# Patient Record
Sex: Female | Born: 1950 | Race: Black or African American | Hispanic: No | Marital: Married | Smoking: Former smoker
Health system: Southern US, Community
[De-identification: ages and names within clinical notes are randomized; demographics above are authoritative.]

## PROBLEM LIST (undated history)

## (undated) DIAGNOSIS — Z789 Other specified health status: Secondary | ICD-10-CM

## (undated) DIAGNOSIS — U071 COVID-19: Secondary | ICD-10-CM

## (undated) DIAGNOSIS — M069 Rheumatoid arthritis, unspecified: Secondary | ICD-10-CM

## (undated) DIAGNOSIS — K219 Gastro-esophageal reflux disease without esophagitis: Secondary | ICD-10-CM

## (undated) HISTORY — PX: APPENDECTOMY: SHX54

## (undated) HISTORY — PX: ABDOMINAL HYSTERECTOMY: SHX81

---

## 2005-06-04 ENCOUNTER — Emergency Department: Payer: Self-pay | Admitting: Emergency Medicine

## 2006-09-24 ENCOUNTER — Ambulatory Visit: Payer: Self-pay

## 2007-10-08 ENCOUNTER — Ambulatory Visit: Payer: Self-pay

## 2008-04-28 ENCOUNTER — Emergency Department: Payer: Self-pay | Admitting: Emergency Medicine

## 2008-11-27 ENCOUNTER — Emergency Department: Payer: Self-pay | Admitting: Emergency Medicine

## 2008-11-30 ENCOUNTER — Ambulatory Visit: Payer: Self-pay

## 2009-04-27 ENCOUNTER — Emergency Department: Payer: Self-pay | Admitting: Emergency Medicine

## 2010-05-26 ENCOUNTER — Emergency Department: Payer: Self-pay | Admitting: Emergency Medicine

## 2010-05-28 ENCOUNTER — Emergency Department: Payer: Self-pay | Admitting: Unknown Physician Specialty

## 2010-08-15 ENCOUNTER — Ambulatory Visit: Payer: Self-pay

## 2011-01-05 ENCOUNTER — Inpatient Hospital Stay: Payer: Self-pay | Admitting: *Deleted

## 2011-08-14 ENCOUNTER — Ambulatory Visit: Payer: Self-pay

## 2012-10-07 ENCOUNTER — Ambulatory Visit: Payer: Self-pay

## 2012-12-04 ENCOUNTER — Ambulatory Visit: Payer: Self-pay | Admitting: Physician Assistant

## 2013-08-31 ENCOUNTER — Ambulatory Visit: Payer: Self-pay | Admitting: Unknown Physician Specialty

## 2013-11-09 ENCOUNTER — Ambulatory Visit: Payer: Self-pay | Admitting: Family Medicine

## 2014-01-15 ENCOUNTER — Emergency Department: Payer: Self-pay | Admitting: Emergency Medicine

## 2014-01-15 LAB — URINALYSIS, COMPLETE
BILIRUBIN, UR: NEGATIVE
Glucose,UR: NEGATIVE mg/dL (ref 0–75)
NITRITE: NEGATIVE
PH: 5 (ref 4.5–8.0)
PROTEIN: NEGATIVE
SPECIFIC GRAVITY: 1.021 (ref 1.003–1.030)
WBC UR: 10 /HPF (ref 0–5)

## 2014-01-17 LAB — URINE CULTURE

## 2014-01-19 ENCOUNTER — Emergency Department: Payer: Self-pay | Admitting: Emergency Medicine

## 2014-01-19 LAB — CBC WITH DIFFERENTIAL/PLATELET
Basophil #: 0 10*3/uL (ref 0.0–0.1)
Basophil %: 0.4 %
Eosinophil #: 0 10*3/uL (ref 0.0–0.7)
Eosinophil %: 0.5 %
HCT: 39.5 % (ref 35.0–47.0)
HGB: 13 g/dL (ref 12.0–16.0)
Lymphocyte #: 1.6 10*3/uL (ref 1.0–3.6)
Lymphocyte %: 50.3 %
MCH: 28.5 pg (ref 26.0–34.0)
MCHC: 33 g/dL (ref 32.0–36.0)
MCV: 86 fL (ref 80–100)
Monocyte #: 0.4 x10 3/mm (ref 0.2–0.9)
Monocyte %: 12.7 %
Neutrophil #: 1.1 10*3/uL — ABNORMAL LOW (ref 1.4–6.5)
Neutrophil %: 36.1 %
Platelet: 169 10*3/uL (ref 150–440)
RBC: 4.57 10*6/uL (ref 3.80–5.20)
RDW: 14 % (ref 11.5–14.5)
WBC: 3.2 10*3/uL — ABNORMAL LOW (ref 3.6–11.0)

## 2014-01-19 LAB — URINALYSIS, COMPLETE
Bacteria: NONE SEEN
Bilirubin,UR: NEGATIVE
Glucose,UR: NEGATIVE mg/dL (ref 0–75)
Ketone: NEGATIVE
Nitrite: NEGATIVE
Ph: 6 (ref 4.5–8.0)
Protein: NEGATIVE
RBC,UR: 4 /HPF (ref 0–5)
Specific Gravity: 1.01 (ref 1.003–1.030)
Squamous Epithelial: 2
Transitional Epi: <1
WBC UR: 6 /HPF (ref 0–5)

## 2014-01-19 LAB — COMPREHENSIVE METABOLIC PANEL
ALBUMIN: 3.1 g/dL — AB (ref 3.4–5.0)
ALK PHOS: 58 U/L
ALT: 33 U/L (ref 12–78)
Anion Gap: 2 — ABNORMAL LOW (ref 7–16)
BUN: 7 mg/dL (ref 7–18)
Bilirubin,Total: 0.2 mg/dL (ref 0.2–1.0)
CO2: 32 mmol/L (ref 21–32)
CREATININE: 0.87 mg/dL (ref 0.60–1.30)
Calcium, Total: 8.1 mg/dL — ABNORMAL LOW (ref 8.5–10.1)
Chloride: 104 mmol/L (ref 98–107)
EGFR (Non-African Amer.): >60
GLUCOSE: 82 mg/dL (ref 65–99)
OSMOLALITY: 273 (ref 275–301)
POTASSIUM: 3.7 mmol/L (ref 3.5–5.1)
SGOT(AST): 44 U/L — ABNORMAL HIGH (ref 15–37)
Sodium: 138 mmol/L (ref 136–145)
TOTAL PROTEIN: 7.8 g/dL (ref 6.4–8.2)

## 2014-07-28 ENCOUNTER — Ambulatory Visit: Payer: Self-pay | Admitting: Physician Assistant

## 2014-11-24 ENCOUNTER — Ambulatory Visit: Payer: Self-pay | Admitting: Family Medicine

## 2015-08-04 ENCOUNTER — Other Ambulatory Visit: Payer: Self-pay | Admitting: Surgery

## 2015-08-04 DIAGNOSIS — M65811 Other synovitis and tenosynovitis, right shoulder: Secondary | ICD-10-CM

## 2015-08-04 DIAGNOSIS — M65812 Other synovitis and tenosynovitis, left shoulder: Secondary | ICD-10-CM

## 2015-08-04 DIAGNOSIS — M7541 Impingement syndrome of right shoulder: Secondary | ICD-10-CM

## 2015-08-04 DIAGNOSIS — M7542 Impingement syndrome of left shoulder: Secondary | ICD-10-CM

## 2015-08-12 ENCOUNTER — Ambulatory Visit
Admission: RE | Admit: 2015-08-12 | Discharge: 2015-08-12 | Disposition: A | Discharge status: Home / Self Care | Payer: Commercial Managed Care - HMO | Source: Ambulatory Visit | Attending: Surgery | Admitting: Surgery

## 2015-08-12 DIAGNOSIS — M65811 Other synovitis and tenosynovitis, right shoulder: Secondary | ICD-10-CM

## 2015-08-12 DIAGNOSIS — M7582 Other shoulder lesions, left shoulder: Secondary | ICD-10-CM | POA: Diagnosis not present

## 2015-08-12 DIAGNOSIS — M7542 Impingement syndrome of left shoulder: Secondary | ICD-10-CM

## 2015-08-12 DIAGNOSIS — M7552 Bursitis of left shoulder: Secondary | ICD-10-CM | POA: Insufficient documentation

## 2015-08-12 DIAGNOSIS — M19011 Primary osteoarthritis, right shoulder: Secondary | ICD-10-CM | POA: Diagnosis not present

## 2015-08-12 DIAGNOSIS — M7541 Impingement syndrome of right shoulder: Secondary | ICD-10-CM

## 2015-08-12 DIAGNOSIS — M19012 Primary osteoarthritis, left shoulder: Secondary | ICD-10-CM | POA: Insufficient documentation

## 2015-08-12 DIAGNOSIS — M65812 Other synovitis and tenosynovitis, left shoulder: Secondary | ICD-10-CM

## 2015-11-22 ENCOUNTER — Encounter: Payer: Self-pay | Admitting: *Deleted

## 2015-11-23 DIAGNOSIS — M65811 Other synovitis and tenosynovitis, right shoulder: Secondary | ICD-10-CM | POA: Diagnosis not present

## 2015-11-23 DIAGNOSIS — M75101 Unspecified rotator cuff tear or rupture of right shoulder, not specified as traumatic: Secondary | ICD-10-CM | POA: Diagnosis not present

## 2015-11-30 ENCOUNTER — Encounter
Admission: RE | Disposition: A | Discharge status: Home / Self Care | Payer: Self-pay | Source: Ambulatory Visit | Attending: Surgery

## 2015-11-30 ENCOUNTER — Ambulatory Visit
Admission: RE | Admit: 2015-11-30 | Discharge: 2015-11-30 | Disposition: A | Discharge status: Home / Self Care | Payer: PPO | Source: Ambulatory Visit | Attending: Surgery | Admitting: Surgery

## 2015-11-30 ENCOUNTER — Ambulatory Visit: Payer: PPO | Admitting: Anesthesiology

## 2015-11-30 DIAGNOSIS — M75121 Complete rotator cuff tear or rupture of right shoulder, not specified as traumatic: Secondary | ICD-10-CM | POA: Insufficient documentation

## 2015-11-30 DIAGNOSIS — M7541 Impingement syndrome of right shoulder: Secondary | ICD-10-CM | POA: Diagnosis not present

## 2015-11-30 DIAGNOSIS — J3089 Other allergic rhinitis: Secondary | ICD-10-CM | POA: Diagnosis not present

## 2015-11-30 DIAGNOSIS — Z79899 Other long term (current) drug therapy: Secondary | ICD-10-CM | POA: Diagnosis not present

## 2015-11-30 DIAGNOSIS — K219 Gastro-esophageal reflux disease without esophagitis: Secondary | ICD-10-CM | POA: Insufficient documentation

## 2015-11-30 DIAGNOSIS — M25811 Other specified joint disorders, right shoulder: Secondary | ICD-10-CM | POA: Diagnosis not present

## 2015-11-30 DIAGNOSIS — Z886 Allergy status to analgesic agent status: Secondary | ICD-10-CM | POA: Insufficient documentation

## 2015-11-30 DIAGNOSIS — Z7951 Long term (current) use of inhaled steroids: Secondary | ICD-10-CM | POA: Insufficient documentation

## 2015-11-30 DIAGNOSIS — M65811 Other synovitis and tenosynovitis, right shoulder: Secondary | ICD-10-CM | POA: Diagnosis not present

## 2015-11-30 HISTORY — PX: SHOULDER ARTHROSCOPY: SHX128

## 2015-11-30 HISTORY — DX: Other specified health status: Z78.9

## 2015-11-30 SURGERY — ARTHROSCOPY, SHOULDER
Anesthesia: Monitor Anesthesia Care | Laterality: Right

## 2015-11-30 MED ORDER — METOCLOPRAMIDE HCL 5 MG/ML IJ SOLN: 5.0000 mg | Freq: Three times a day (TID) | INTRAMUSCULAR | Status: DC | PRN

## 2015-11-30 MED ORDER — LACTATED RINGERS IV SOLN
INTRAVENOUS | Status: DC | PRN
  Administered 2015-11-30: 1500 mL

## 2015-11-30 MED ORDER — ONDANSETRON HCL 4 MG/2ML IJ SOLN: 4.0000 mg | Freq: Four times a day (QID) | INTRAMUSCULAR | Status: DC | PRN

## 2015-11-30 MED ORDER — LACTATED RINGERS IV SOLN
INTRAVENOUS | Status: DC | PRN
  Administered 2015-11-30: 14:00:00 via INTRAVENOUS

## 2015-11-30 MED ORDER — PROPOFOL 10 MG/ML IV BOLUS
INTRAVENOUS | Status: DC | PRN
  Administered 2015-11-30: 50 mg via INTRAVENOUS

## 2015-11-30 MED ORDER — ROPIVACAINE HCL 5 MG/ML IJ SOLN
INTRAMUSCULAR | Status: DC | PRN
  Administered 2015-11-30: 200 mg via EPIDURAL

## 2015-11-30 MED ORDER — DEXAMETHASONE SODIUM PHOSPHATE 4 MG/ML IJ SOLN
INTRAMUSCULAR | Status: DC | PRN
  Administered 2015-11-30: 4 mg via INTRAVENOUS

## 2015-11-30 MED ORDER — MIDAZOLAM HCL 2 MG/2ML IJ SOLN
INTRAMUSCULAR | Status: DC | PRN
  Administered 2015-11-30: 2 mg via INTRAVENOUS

## 2015-11-30 MED ORDER — CEFAZOLIN SODIUM-DEXTROSE 2-3 GM-% IV SOLR: 2.0000 g | Freq: Once | INTRAVENOUS | Status: DC

## 2015-11-30 MED ORDER — OXYCODONE HCL 5 MG PO TABS: 5.0000 mg | ORAL_TABLET | ORAL | Status: DC | PRN

## 2015-11-30 MED ORDER — ACETAMINOPHEN 160 MG/5ML PO SOLN
325.0000 mg | ORAL | Status: DC | PRN
Start: 2015-11-30 — End: 2015-11-30

## 2015-11-30 MED ORDER — METOCLOPRAMIDE HCL 5 MG PO TABS: 5.0000 mg | ORAL_TABLET | Freq: Three times a day (TID) | ORAL | Status: DC | PRN

## 2015-11-30 MED ORDER — OXYCODONE HCL 5 MG/5ML PO SOLN: 5.0000 mg | Freq: Once | ORAL | Status: DC | PRN

## 2015-11-30 MED ORDER — FENTANYL CITRATE (PF) 100 MCG/2ML IJ SOLN
INTRAMUSCULAR | Status: DC | PRN
  Administered 2015-11-30: 100 ug via INTRAVENOUS

## 2015-11-30 MED ORDER — OXYCODONE HCL 5 MG PO TABS: 5.0000 mg | ORAL_TABLET | Freq: Once | ORAL | Status: DC | PRN

## 2015-11-30 MED ORDER — BUPIVACAINE-EPINEPHRINE (PF) 0.5% -1:200000 IJ SOLN
INTRAMUSCULAR | Status: DC | PRN
  Administered 2015-11-30: 20 mL

## 2015-11-30 MED ORDER — POTASSIUM CHLORIDE IN NACL 20-0.9 MEQ/L-% IV SOLN: INTRAVENOUS | Status: DC

## 2015-11-30 MED ORDER — LACTATED RINGERS IV SOLN: INTRAVENOUS | Status: DC

## 2015-11-30 MED ORDER — ACETAMINOPHEN 325 MG PO TABS: 325.0000 mg | ORAL_TABLET | ORAL | Status: DC | PRN

## 2015-11-30 MED ORDER — ONDANSETRON HCL 4 MG/2ML IJ SOLN
INTRAMUSCULAR | Status: DC | PRN
  Administered 2015-11-30: 4 mg via INTRAVENOUS

## 2015-11-30 MED ORDER — ONDANSETRON HCL 4 MG PO TABS
4.0000 mg | ORAL_TABLET | Freq: Four times a day (QID) | ORAL | Status: DC | PRN
Start: 2015-11-30 — End: 2015-11-30

## 2015-11-30 MED ORDER — CEFAZOLIN SODIUM-DEXTROSE 2-3 GM-% IV SOLR
INTRAVENOUS | Status: DC | PRN
  Administered 2015-11-30: 2 g via INTRAVENOUS

## 2015-11-30 MED ORDER — ONDANSETRON HCL 4 MG/2ML IJ SOLN: 4.0000 mg | Freq: Once | INTRAMUSCULAR | Status: DC | PRN

## 2015-11-30 SURGICAL SUPPLY — 39 items
ANCHOR JUGGERKNOT WTAP NDL 2.9 (Anchor) ×3 IMPLANT
BIT DRILL JUGRKNT W/NDL BIT2.9 (DRILL) ×1 IMPLANT
BLADE FULL RADIUS 3.5 (BLADE) ×3 IMPLANT
BLADE SHAVER 4.5X7 STR FR (MISCELLANEOUS) IMPLANT
BUR ACROMIONIZER 4.0 (BURR) ×3 IMPLANT
BUR BR 5.5 WIDE MOUTH (BURR) IMPLANT
CHLORAPREP W/TINT 26ML (MISCELLANEOUS) ×6 IMPLANT
COVER LIGHT HANDLE UNIVERSAL (MISCELLANEOUS) ×6 IMPLANT
COVER MAYO STAND STRL (DRAPES) ×3 IMPLANT
DRAPE IMP U-DRAPE 54X76 (DRAPES) ×6 IMPLANT
DRILL JUGGERKNOT W/NDL BIT 2.9 (DRILL) ×3
GAUZE PETRO XEROFOAM 1X8 (MISCELLANEOUS) ×3 IMPLANT
GAUZE SPONGE 4X4 12PLY STRL (GAUZE/BANDAGES/DRESSINGS) ×3 IMPLANT
GLOVE BIO SURGEON STRL SZ8 (GLOVE) ×6 IMPLANT
GLOVE INDICATOR 8.0 STRL GRN (GLOVE) ×3 IMPLANT
GOWN STRL REUS W/ TWL LRG LVL3 (GOWN DISPOSABLE) ×2 IMPLANT
GOWN STRL REUS W/ TWL XL LVL3 (GOWN DISPOSABLE) ×1 IMPLANT
GOWN STRL REUS W/TWL LRG LVL3 (GOWN DISPOSABLE) ×4
GOWN STRL REUS W/TWL XL LVL3 (GOWN DISPOSABLE) ×2
IV LACTATED RINGER IRRG 3000ML (IV SOLUTION) ×2
IV LR IRRIG 3000ML ARTHROMATIC (IV SOLUTION) ×1 IMPLANT
KIT CANNULA 8X76-LX IN CANNULA (CANNULA) ×3 IMPLANT
KIT ROOM TURNOVER OR (KITS) ×3 IMPLANT
MANIFOLD 4PT FOR NEPTUNE1 (MISCELLANEOUS) ×3 IMPLANT
MAT BLUE FLOOR 46X72 FLO (MISCELLANEOUS) ×3 IMPLANT
NDL MAYO CATGUT SZ5 (NEEDLE)
NDL SUT 5 .5 CRC TPR PNT MAYO (NEEDLE) IMPLANT
NEEDLE HYPO 21X1.5 SAFETY (NEEDLE) ×3 IMPLANT
PACK ARTHROSCOPY SHOULDER (MISCELLANEOUS) ×3 IMPLANT
PAD GROUND ADULT SPLIT (MISCELLANEOUS) ×3 IMPLANT
STAPLER SKIN PROX 35W (STAPLE) ×3 IMPLANT
STRAP BODY AND KNEE 60X3 (MISCELLANEOUS) ×6 IMPLANT
SUT ETHIBOND 0 MO6 C/R (SUTURE) ×3 IMPLANT
SUT VIC AB 2-0 CT1 27 (SUTURE) ×2
SUT VIC AB 2-0 CT1 TAPERPNT 27 (SUTURE) ×1 IMPLANT
SYR 30ML LL (SYRINGE) IMPLANT
TAPE MICROFOAM 4IN (TAPE) ×3 IMPLANT
TUBING ARTHRO INFLOW-ONLY STRL (TUBING) ×3 IMPLANT
WAND HAND CNTRL MULTIVAC 90 (MISCELLANEOUS) ×3 IMPLANT

## 2015-11-30 NOTE — Anesthesia Postprocedure Evaluation (Signed)
Anesthesia Post Note  Patient: Donna Powell  Procedure(s) Performed: Procedure(s) (LRB): ARTHROSCOPY SHOULDER DEBRIDEMENT DECOMPRESSION ROTATOR CUFF REPAIR (Right)  Patient location during evaluation: PACU Anesthesia Type: General and Regional Level of consciousness: awake and alert Pain management: pain level controlled Vital Signs Assessment: post-procedure vital signs reviewed and stable Respiratory status: spontaneous breathing, nonlabored ventilation, respiratory function stable and patient connected to nasal cannula oxygen Cardiovascular status: blood pressure returned to baseline and stable Postop Assessment: no signs of nausea or vomiting Anesthetic complications: no    Amaryllis Dyke

## 2015-11-30 NOTE — Anesthesia Procedure Notes (Addendum)
Anesthesia Regional Block:  Interscalene brachial plexus block  Pre-Anesthetic Checklist: ,, timeout performed, Correct Patient, Correct Site, Correct Laterality, Correct Procedure, Correct Position, site marked, Risks and benefits discussed,  Surgical consent,  Pre-op evaluation,  At surgeon's request and post-op pain management  Laterality: Right  Prep: chloraprep       Needles:  Injection technique: Single-shot  Needle Type: Stimiplex     Needle Length: 10cm 10 cm Needle Gauge: 21 and 21 G    Additional Needles:  Procedures: ultrasound guided (picture in chart) Interscalene brachial plexus block Narrative:  Start time: 11/30/2015 1:54 PM End time: 11/30/2015 2:00 PM Injection made incrementally with aspirations every 40 mL.  Performed by: Personally  Anesthesiologist: Amaryllis Dyke  Additional Notes: Patient tolerated procedure very well. Prior to block pain was 10/10. Pain was 0/10 before going to the OR.    Procedure Name: LMA Insertion Date/Time: 11/30/2015 2:30 PM Performed by: Londell Moh Pre-anesthesia Checklist: Patient identified, Emergency Drugs available, Suction available, Timeout performed and Patient being monitored Patient Re-evaluated:Patient Re-evaluated prior to inductionOxygen Delivery Method: Circle system utilized Preoxygenation: Pre-oxygenation with 100% oxygen Intubation Type: Combination inhalational/ intravenous induction LMA: LMA inserted LMA Size: 4.0 Number of attempts: 1 Placement Confirmation: positive ETCO2 and breath sounds checked- equal and bilateral Tube secured with: Tape

## 2015-11-30 NOTE — H&P (Signed)
Paper H&P to be scanned into permanent record. H&P reviewed. No changes. 

## 2015-11-30 NOTE — Op Note (Signed)
11/30/2015  3:52 PM  Patient:   Donna Powell  Pre-Op Diagnosis:   Impingement/tendinopathy with probable rotator cuff tear, right shoulder.  Postoperative diagnosis: Impingement/tendinopathy with full thickness rotator cuff tear, right shoulder.  Procedure: Limited arthroscopic debridement, arthroscopic subacromial decompression, and mini-open rotator cuff repair, right shoulder.  Anesthesia: General laryngeal mask with interscalene block placed preoperatively by the anesthesiologist.  Surgeon:   Pascal Lux, MD  Assistant:   None  Findings: As above. The labrum demonstrated some mild fraying anteriorly and superiorly but had no glenoid detachment. There was a small full-thickness tear involving the anterior insertional fibers of the supraspinatus tendon. The biceps tendon was in excellent condition as was the rest of the rotator cuff. There were grade 1 chondromalacial changes of the glenoid centrally, but otherwise the articular surfaces of both the glenoid and humerus were in excellent condition.  Complications: None  Fluids:   400 cc  Estimated blood loss: <5 cc  Tourniquet time: None  Drains: None  Closure: Staples   Brief clinical note: The patient is a 65 year old female with a 1+ year history of right shoulder pain. The patient's symptoms have progressed despite medications, activity modification, etc. The patient's history and examination are consistent with impingement/tendinopathy with a probable rotator cuff tear as suggested by MRI scan. The patient presents at this time for definitive management of these shoulder symptoms.  Procedure: The patient underwent placement of an interscalene block by the anesthesiologist in the preoperative holding area before being brought into the operating room and lain in the supine position. The patient then underwent general laryngeal mask anesthesia before being repositioned in the beach chair position  using the beach chair positioner. The right shoulder and upper extremity were prepped with ChloraPrep solution before being draped sterilely. Preoperative antibiotics were administered. A timeout was performed to confirm the proper surgical site before the expected portal sites and incision site were injected with 0.5% Sensorcaine with epinephrine. A posterior portal was created and the glenohumeral joint thoroughly inspected with the findings as described above. An anterior portal was created using an outside-in technique. The labrum and rotator cuff were further probed, again confirming the above-noted findings. The areas of labral fraying anteriorly and superiorly were debrided using the full-radius resector as were some areas of synovitis superiorly. The ArthroCare wand was inserted and used to obtain hemostasis as well as to "anneal" the labrum superiorly and anteriorly. The instruments were removed from the joint after suctioning the excess fluid.  The camera was repositioned through the posterior portal into the subacromial space. A separate lateral portal was created using an outside-in technique. The 3.5 mm full-radius resector was introduced and used to perform a subtotal bursectomy. The ArthroCare wand was then inserted and used to remove the periosteal tissue off the undersurface of the anterior third of the acromion as well as to recess the coracoacromial ligament from its attachment along the anterior and lateral margins of the acromion. The 4.0 mm acromionizing bur was introduced and used to complete the decompression by removing the undersurface of the anterior third of the acromion. The full radius resector was reintroduced to remove any residual bony debris before the ArthroCare wand was reintroduced to obtain hemostasis. The instruments were then removed from the subacromial space after suctioning the excess fluid.  An approximately 3.5-4 cm incision was made over the anterolateral aspect of  the shoulder beginning at the anterolateral corner of the acromion and extending distally in line with the bicipital groove. This  incision was carried down through the subcutaneous tissues to expose the deltoid fascia. The raphae between the anterior and middle thirds was identified and this plane developed to provide access into the subacromial space. Additional bursal tissues were debrided sharply using Metzenbaum scissors. The rotator cuff tear was readily identified. The margins were debrided sharply with a #15 blade and the exposed greater tuberosity roughened with a rongeur. The tear was repaired using a single Biomet 2.9 mm JuggerKnot anchors. Several of these sutures were then brought back laterally through the more lateral rotator cuff tissues and retied to create a two-layer closure. An additional #0 Ethibond suture was used to reapproximate the small longitudinal component of the tear. An apparent watertight closure was obtained.  The wound was copiously irrigated with sterile saline solution before the deltoid raphae was reapproximated using 2-0 Vicryl interrupted sutures. The subcutaneous tissues were closed in two layers using 2-0 Vicryl interrupted sutures before the skin was closed using staples. The portal sites also were closed using staples. A sterile bulky dressing was applied to the shoulder before the arm was placed into a shoulder immobilizer. The patient was then awakened, extubated, and returned to the recovery room in satisfactory condition after tolerating the procedure well.

## 2015-11-30 NOTE — Anesthesia Preprocedure Evaluation (Signed)
Anesthesia Evaluation  Patient identified by MRN, date of birth, ID band Patient awake    Reviewed: Allergy & Precautions, NPO status , Patient's Chart, lab work & pertinent test results  Airway Mallampati: II  TM Distance: >3 FB Neck ROM: full    Dental   Pulmonary former smoker,    Pulmonary exam normal        Cardiovascular Normal cardiovascular exam     Neuro/Psych    GI/Hepatic   Endo/Other    Renal/GU      Musculoskeletal   Abdominal   Peds  Hematology   Anesthesia Other Findings   Reproductive/Obstetrics                             Anesthesia Physical Anesthesia Plan  ASA: I  Anesthesia Plan: Regional and MAC   Post-op Pain Management:    Induction:   Airway Management Planned:   Additional Equipment:   Intra-op Plan:   Post-operative Plan:   Informed Consent: I have reviewed the patients History and Physical, chart, labs and discussed the procedure including the risks, benefits and alternatives for the proposed anesthesia with the patient or authorized representative who has indicated his/her understanding and acceptance.     Plan Discussed with: CRNA  Anesthesia Plan Comments:         Anesthesia Quick Evaluation

## 2015-11-30 NOTE — Discharge Instructions (Signed)
General Anesthesia, Adult, Care After Refer to this sheet in the next few weeks. These instructions provide you with information on caring for yourself after your procedure. Your health care provider may also give you more specific instructions. Your treatment has been planned according to current medical practices, but problems sometimes occur. Call your health care provider if you have any problems or questions after your procedure. WHAT TO EXPECT AFTER THE PROCEDURE After the procedure, it is typical to experience:  Sleepiness.  Nausea and vomiting. HOME CARE INSTRUCTIONS  For the first 24 hours after general anesthesia:  Have a responsible person with you.  Do not drive a car. If you are alone, do not take public transportation.  Do not drink alcohol.  Do not take medicine that has not been prescribed by your health care provider.  Do not sign important papers or make important decisions.  You may resume a normal diet and activities as directed by your health care provider.  Change bandages (dressings) as directed.  If you have questions or problems that seem related to general anesthesia, call the hospital and ask for the anesthetist or anesthesiologist on call. SEEK MEDICAL CARE IF:  You have nausea and vomiting that continue the day after anesthesia.  You develop a rash. SEEK IMMEDIATE MEDICAL CARE IF:   You have difficulty breathing.  You have chest pain.  You have any allergic problems.   This information is not intended to replace advice given to you by your health care provider. Make sure you discuss any questions you have with your health care provider.   Document Released: 02/11/2001 Document Revised: 11/26/2014 Document Reviewed: 03/05/2012 Elsevier Interactive Patient Education 2016 [employer].  Keep dressing dry and intact.  May shower after dressing changed on post-op day #4 (Sunday).  Cover staples with Band-Aids after drying off. Apply ice  frequently to shoulder. Keep shoulder immobilizer on at all times except may remove for bathing purposes. Follow-up in 10-14 days or as scheduled.

## 2015-11-30 NOTE — Transfer of Care (Signed)
Immediate Anesthesia Transfer of Care Note  Patient: Donna Powell  Procedure(s) Performed: Procedure(s): ARTHROSCOPY SHOULDER DEBRIDEMENT DECOMPRESSION ROTATOR CUFF REPAIR (Right)  Patient Location: PACU  Anesthesia Type: Regional, MAC  Level of Consciousness: awake, alert  and patient cooperative  Airway and Oxygen Therapy: Patient Spontanous Breathing and Patient connected to supplemental oxygen  Post-op Assessment: Post-op Vital signs reviewed, Patient's Cardiovascular Status Stable, Respiratory Function Stable, Patent Airway and No signs of Nausea or vomiting  Post-op Vital Signs: Reviewed and stable  Complications: No apparent anesthesia complications

## 2015-12-01 ENCOUNTER — Encounter: Payer: Self-pay | Admitting: Surgery

## 2015-12-07 DIAGNOSIS — M25511 Pain in right shoulder: Secondary | ICD-10-CM | POA: Diagnosis not present

## 2015-12-12 DIAGNOSIS — M25511 Pain in right shoulder: Secondary | ICD-10-CM | POA: Diagnosis not present

## 2016-03-05 DIAGNOSIS — M21611 Bunion of right foot: Secondary | ICD-10-CM | POA: Diagnosis not present

## 2016-03-05 DIAGNOSIS — M722 Plantar fascial fibromatosis: Secondary | ICD-10-CM | POA: Diagnosis not present

## 2016-03-30 DIAGNOSIS — M75121 Complete rotator cuff tear or rupture of right shoulder, not specified as traumatic: Secondary | ICD-10-CM | POA: Diagnosis not present

## 2016-03-30 DIAGNOSIS — M7542 Impingement syndrome of left shoulder: Secondary | ICD-10-CM | POA: Diagnosis not present

## 2016-03-30 DIAGNOSIS — M7541 Impingement syndrome of right shoulder: Secondary | ICD-10-CM | POA: Diagnosis not present

## 2016-03-30 DIAGNOSIS — M65811 Other synovitis and tenosynovitis, right shoulder: Secondary | ICD-10-CM | POA: Diagnosis not present

## 2016-05-08 DIAGNOSIS — D485 Neoplasm of uncertain behavior of skin: Secondary | ICD-10-CM | POA: Diagnosis not present

## 2016-05-09 DIAGNOSIS — D2339 Other benign neoplasm of skin of other parts of face: Secondary | ICD-10-CM | POA: Diagnosis not present

## 2016-05-28 DIAGNOSIS — M545 Low back pain: Secondary | ICD-10-CM | POA: Diagnosis not present

## 2016-05-28 DIAGNOSIS — M9903 Segmental and somatic dysfunction of lumbar region: Secondary | ICD-10-CM | POA: Diagnosis not present

## 2016-05-28 DIAGNOSIS — M791 Myalgia: Secondary | ICD-10-CM | POA: Diagnosis not present

## 2016-07-13 DIAGNOSIS — M62838 Other muscle spasm: Secondary | ICD-10-CM | POA: Diagnosis not present

## 2016-07-13 DIAGNOSIS — M722 Plantar fascial fibromatosis: Secondary | ICD-10-CM | POA: Diagnosis not present

## 2016-07-19 DIAGNOSIS — M722 Plantar fascial fibromatosis: Secondary | ICD-10-CM | POA: Diagnosis not present

## 2016-07-19 DIAGNOSIS — M2011 Hallux valgus (acquired), right foot: Secondary | ICD-10-CM | POA: Diagnosis not present

## 2016-08-09 DIAGNOSIS — M2011 Hallux valgus (acquired), right foot: Secondary | ICD-10-CM | POA: Diagnosis not present

## 2016-08-09 DIAGNOSIS — M722 Plantar fascial fibromatosis: Secondary | ICD-10-CM | POA: Diagnosis not present

## 2016-09-10 DIAGNOSIS — M9901 Segmental and somatic dysfunction of cervical region: Secondary | ICD-10-CM | POA: Diagnosis not present

## 2016-09-10 DIAGNOSIS — M4602 Spinal enthesopathy, cervical region: Secondary | ICD-10-CM | POA: Diagnosis not present

## 2016-10-24 DIAGNOSIS — Z0000 Encounter for general adult medical examination without abnormal findings: Secondary | ICD-10-CM | POA: Diagnosis not present

## 2016-10-24 DIAGNOSIS — M8580 Other specified disorders of bone density and structure, unspecified site: Secondary | ICD-10-CM | POA: Diagnosis not present

## 2016-10-24 DIAGNOSIS — Z23 Encounter for immunization: Secondary | ICD-10-CM | POA: Diagnosis not present

## 2016-10-24 DIAGNOSIS — Z1231 Encounter for screening mammogram for malignant neoplasm of breast: Secondary | ICD-10-CM | POA: Diagnosis not present

## 2016-10-24 DIAGNOSIS — K219 Gastro-esophageal reflux disease without esophagitis: Secondary | ICD-10-CM | POA: Diagnosis not present

## 2017-01-03 DIAGNOSIS — M9901 Segmental and somatic dysfunction of cervical region: Secondary | ICD-10-CM | POA: Diagnosis not present

## 2017-01-03 DIAGNOSIS — M542 Cervicalgia: Secondary | ICD-10-CM | POA: Diagnosis not present

## 2017-01-08 DIAGNOSIS — M722 Plantar fascial fibromatosis: Secondary | ICD-10-CM | POA: Diagnosis not present

## 2017-01-08 DIAGNOSIS — M2011 Hallux valgus (acquired), right foot: Secondary | ICD-10-CM | POA: Diagnosis not present

## 2017-03-22 DIAGNOSIS — M9901 Segmental and somatic dysfunction of cervical region: Secondary | ICD-10-CM | POA: Diagnosis not present

## 2017-03-22 DIAGNOSIS — M542 Cervicalgia: Secondary | ICD-10-CM | POA: Diagnosis not present

## 2017-05-15 DIAGNOSIS — J029 Acute pharyngitis, unspecified: Secondary | ICD-10-CM | POA: Diagnosis not present

## 2017-06-19 DIAGNOSIS — M545 Low back pain: Secondary | ICD-10-CM | POA: Diagnosis not present

## 2017-06-19 DIAGNOSIS — M9903 Segmental and somatic dysfunction of lumbar region: Secondary | ICD-10-CM | POA: Diagnosis not present

## 2017-06-19 DIAGNOSIS — M791 Myalgia: Secondary | ICD-10-CM | POA: Diagnosis not present

## 2017-07-18 DIAGNOSIS — M2011 Hallux valgus (acquired), right foot: Secondary | ICD-10-CM | POA: Diagnosis not present

## 2017-07-18 DIAGNOSIS — M722 Plantar fascial fibromatosis: Secondary | ICD-10-CM | POA: Diagnosis not present

## 2017-10-31 DIAGNOSIS — M2011 Hallux valgus (acquired), right foot: Secondary | ICD-10-CM | POA: Diagnosis not present

## 2017-10-31 DIAGNOSIS — M722 Plantar fascial fibromatosis: Secondary | ICD-10-CM | POA: Diagnosis not present

## 2017-11-14 DIAGNOSIS — M9901 Segmental and somatic dysfunction of cervical region: Secondary | ICD-10-CM | POA: Diagnosis not present

## 2017-11-14 DIAGNOSIS — M4602 Spinal enthesopathy, cervical region: Secondary | ICD-10-CM | POA: Diagnosis not present

## 2017-12-16 DIAGNOSIS — M9901 Segmental and somatic dysfunction of cervical region: Secondary | ICD-10-CM | POA: Diagnosis not present

## 2017-12-16 DIAGNOSIS — G44219 Episodic tension-type headache, not intractable: Secondary | ICD-10-CM | POA: Diagnosis not present

## 2017-12-16 DIAGNOSIS — M4602 Spinal enthesopathy, cervical region: Secondary | ICD-10-CM | POA: Diagnosis not present

## 2018-02-17 DIAGNOSIS — Z0000 Encounter for general adult medical examination without abnormal findings: Secondary | ICD-10-CM | POA: Diagnosis not present

## 2018-02-17 DIAGNOSIS — K219 Gastro-esophageal reflux disease without esophagitis: Secondary | ICD-10-CM | POA: Diagnosis not present

## 2018-02-17 DIAGNOSIS — Z1231 Encounter for screening mammogram for malignant neoplasm of breast: Secondary | ICD-10-CM | POA: Diagnosis not present

## 2018-02-17 DIAGNOSIS — M8580 Other specified disorders of bone density and structure, unspecified site: Secondary | ICD-10-CM | POA: Diagnosis not present

## 2018-02-17 DIAGNOSIS — M21611 Bunion of right foot: Secondary | ICD-10-CM | POA: Diagnosis not present

## 2018-02-18 ENCOUNTER — Other Ambulatory Visit: Payer: Self-pay | Admitting: Podiatry

## 2018-02-18 DIAGNOSIS — M2011 Hallux valgus (acquired), right foot: Secondary | ICD-10-CM | POA: Diagnosis not present

## 2018-02-19 NOTE — Discharge Instructions (Signed)
[facility] °[facility] ° °POST OPERATIVE INSTRUCTIONS FOR DR. TROXLER AND DR. FOWLER °[facility] ° ° °1. Take your medication as prescribed.  Pain medication should be taken only as needed. ° °2. Keep the dressing clean, dry and intact. ° °3. Keep your foot elevated above the heart level for the first 48 hours. ° °4. Walking to the bathroom and brief periods of walking are acceptable, unless we have instructed you to be non-weight bearing. ° °5. Always wear your post-op shoe when walking.  Always use your crutches if you are to be non-weight bearing. ° °6. Do not take a shower. Baths are permissible as long as the foot is kept out of the water.  ° °7. Every hour you are awake:  °- Bend your knee 15 times. °- Flex foot 15 times °- Massage calf 15 times ° °8. Call [facility] ([phone]) if any of the following problems occur: °- You develop a temperature or fever. °- The bandage becomes saturated with blood. °- Medication does not stop your pain. °- Injury of the foot occurs. °- Any symptoms of infection including redness, odor, or red streaks running from wound. ° ° °General Anesthesia, Adult, Care After °These instructions provide you with information about caring for yourself after your procedure. Your health care provider may also give you more specific instructions. Your treatment has been planned according to current medical practices, but problems sometimes occur. Call your health care provider if you have any problems or questions after your procedure. °What can I expect after the procedure? °After the procedure, it is common to have: °· Vomiting. °· A sore throat. °· Mental slowness. ° °It is common to feel: °· Nauseous. °· Cold or shivery. °· Sleepy. °· Tired. °· Sore or achy, even in parts of your body where you did not have surgery. ° °Follow these instructions at home: °For at least 24 hours after the procedure: °· Do not: °? Participate in  activities where you could fall or become injured. °? Drive. °? Use heavy machinery. °? Drink alcohol. °? Take sleeping pills or medicines that cause drowsiness. °? Make important decisions or sign legal documents. °? Take care of children on your own. °· Rest. °Eating and drinking °· If you vomit, drink water, juice, or soup when you can drink without vomiting. °· Drink enough fluid to keep your urine clear or pale yellow. °· Make sure you have little or no nausea before eating solid foods. °· Follow the diet recommended by your health care provider. °General instructions °· Have a responsible adult stay with you until you are awake and alert. °· Return to your normal activities as told by your health care provider. Ask your health care provider what activities are safe for you. °· Take over-the-counter and prescription medicines only as told by your health care provider. °· If you smoke, do not smoke without supervision. °· Keep all follow-up visits as told by your health care provider. This is important. °Contact a health care provider if: °· You continue to have nausea or vomiting at home, and medicines are not helpful. °· You cannot drink fluids or start eating again. °· You cannot urinate after 8-12 hours. °· You develop a skin rash. °· You have fever. °· You have increasing redness at the site of your procedure. °Get help right away if: °· You have difficulty breathing. °· You have chest pain. °· You have unexpected bleeding. °· You feel that you   are having a life-threatening or urgent problem. °This information is not intended to replace advice given to you by your health care provider. Make sure you discuss any questions you have with your health care provider. °Document Released: 02/11/2001 Document Revised: 04/09/2016 Document Reviewed: 10/20/2015 °Elsevier Interactive Patient Education © 2018 Elsevier Inc. ° °

## 2018-02-21 DIAGNOSIS — K219 Gastro-esophageal reflux disease without esophagitis: Secondary | ICD-10-CM | POA: Diagnosis not present

## 2018-02-21 DIAGNOSIS — E7800 Pure hypercholesterolemia, unspecified: Secondary | ICD-10-CM | POA: Diagnosis not present

## 2018-02-21 DIAGNOSIS — Z01818 Encounter for other preprocedural examination: Secondary | ICD-10-CM | POA: Diagnosis not present

## 2018-02-26 ENCOUNTER — Ambulatory Visit
Admission: RE | Admit: 2018-02-26 | Discharge: 2018-02-26 | Disposition: A | Discharge status: Home / Self Care | Payer: PPO | Source: Ambulatory Visit | Attending: Podiatry | Admitting: Podiatry

## 2018-02-26 ENCOUNTER — Encounter
Admission: RE | Disposition: A | Discharge status: Home / Self Care | Payer: Self-pay | Source: Ambulatory Visit | Attending: Podiatry

## 2018-02-26 ENCOUNTER — Ambulatory Visit: Payer: PPO | Admitting: Anesthesiology

## 2018-02-26 DIAGNOSIS — Z886 Allergy status to analgesic agent status: Secondary | ICD-10-CM | POA: Diagnosis not present

## 2018-02-26 DIAGNOSIS — Z79899 Other long term (current) drug therapy: Secondary | ICD-10-CM | POA: Insufficient documentation

## 2018-02-26 DIAGNOSIS — M2011 Hallux valgus (acquired), right foot: Secondary | ICD-10-CM | POA: Diagnosis not present

## 2018-02-26 DIAGNOSIS — M1990 Unspecified osteoarthritis, unspecified site: Secondary | ICD-10-CM | POA: Insufficient documentation

## 2018-02-26 DIAGNOSIS — Z87891 Personal history of nicotine dependence: Secondary | ICD-10-CM | POA: Diagnosis not present

## 2018-02-26 DIAGNOSIS — K219 Gastro-esophageal reflux disease without esophagitis: Secondary | ICD-10-CM | POA: Diagnosis not present

## 2018-02-26 DIAGNOSIS — M8580 Other specified disorders of bone density and structure, unspecified site: Secondary | ICD-10-CM | POA: Diagnosis not present

## 2018-02-26 HISTORY — PX: HALLUX VALGUS AUSTIN: SHX6623

## 2018-02-26 HISTORY — DX: Rheumatoid arthritis, unspecified: M06.9

## 2018-02-26 SURGERY — CORRECTION, HALLUX VALGUS
Anesthesia: General | Site: Foot | Laterality: Right | Wound class: "Clean "

## 2018-02-26 MED ORDER — LACTATED RINGERS IV SOLN
10.0000 mL/h | INTRAVENOUS | Status: DC
  Administered 2018-02-26: 10 mL/h via INTRAVENOUS

## 2018-02-26 MED ORDER — DEXAMETHASONE SODIUM PHOSPHATE 4 MG/ML IJ SOLN
INTRAMUSCULAR | Status: DC | PRN
  Administered 2018-02-26: 8 mg via INTRAVENOUS

## 2018-02-26 MED ORDER — PROPOFOL 10 MG/ML IV BOLUS
INTRAVENOUS | Status: DC | PRN
  Administered 2018-02-26: 200 mg via INTRAVENOUS

## 2018-02-26 MED ORDER — POVIDONE-IODINE 7.5 % EX SOLN
Freq: Once | CUTANEOUS | Status: AC
  Administered 2018-02-26: 12:00:00 via TOPICAL

## 2018-02-26 MED ORDER — ONDANSETRON HCL 4 MG/2ML IJ SOLN
INTRAMUSCULAR | Status: DC | PRN
  Administered 2018-02-26: 4 mg via INTRAVENOUS

## 2018-02-26 MED ORDER — MIDAZOLAM HCL 5 MG/5ML IJ SOLN
INTRAMUSCULAR | Status: DC | PRN
  Administered 2018-02-26: 2 mg via INTRAVENOUS

## 2018-02-26 MED ORDER — OXYCODONE-ACETAMINOPHEN 5-325 MG PO TABS: 1.0000 | ORAL_TABLET | ORAL | 0 refills | Status: AC | PRN

## 2018-02-26 MED ORDER — BUPIVACAINE HCL (PF) 0.25 % IJ SOLN
INTRAMUSCULAR | Status: DC | PRN
  Administered 2018-02-26: 10 mL

## 2018-02-26 MED ORDER — BUPIVACAINE LIPOSOME 1.3 % IJ SUSP
INTRAMUSCULAR | Status: DC | PRN
  Administered 2018-02-26: 10 mL

## 2018-02-26 MED ORDER — CEFAZOLIN SODIUM-DEXTROSE 2-4 GM/100ML-% IV SOLN
2.0000 g | INTRAVENOUS | Status: AC
  Administered 2018-02-26: 2 g via INTRAVENOUS

## 2018-02-26 MED ORDER — LIDOCAINE HCL (CARDIAC) 20 MG/ML IV SOLN
INTRAVENOUS | Status: DC | PRN
  Administered 2018-02-26: 40 mg via INTRATRACHEAL

## 2018-02-26 SURGICAL SUPPLY — 37 items
BANDAGE ELASTIC 4 VELCRO NS (GAUZE/BANDAGES/DRESSINGS) ×2 IMPLANT
BENZOIN TINCTURE PRP APPL 2/3 (GAUZE/BANDAGES/DRESSINGS) ×2 IMPLANT
BLADE MED AGGRESSIVE (BLADE) ×1 IMPLANT
BNDG COHESIVE 4X5 TAN STRL (GAUZE/BANDAGES/DRESSINGS) ×2 IMPLANT
BNDG ESMARK 4X12 TAN STRL LF (GAUZE/BANDAGES/DRESSINGS) ×2 IMPLANT
BNDG GAUZE 4.5X4.1 6PLY STRL (MISCELLANEOUS) ×2 IMPLANT
BNDG STRETCH 4X75 STRL LF (GAUZE/BANDAGES/DRESSINGS) ×2 IMPLANT
CANISTER SUCT 1200ML W/VALVE (MISCELLANEOUS) ×2 IMPLANT
COVER LIGHT HANDLE UNIVERSAL (MISCELLANEOUS) ×4 IMPLANT
CUFF TOURN SGL QUICK 18 (TOURNIQUET CUFF) ×1 IMPLANT
DRAPE FLUOR MINI C-ARM 54X84 (DRAPES) ×2 IMPLANT
DURAPREP 26ML APPLICATOR (WOUND CARE) ×2 IMPLANT
ELECT REM PT RETURN 9FT ADLT (ELECTROSURGICAL) ×2
ELECTRODE REM PT RTRN 9FT ADLT (ELECTROSURGICAL) ×1 IMPLANT
GAUZE PETRO XEROFOAM 1X8 (MISCELLANEOUS) ×2 IMPLANT
GAUZE SPONGE 4X4 12PLY STRL (GAUZE/BANDAGES/DRESSINGS) ×2 IMPLANT
GLOVE BIO SURGEON STRL SZ7.5 (GLOVE) ×2 IMPLANT
GLOVE INDICATOR 8.0 STRL GRN (GLOVE) ×2 IMPLANT
GOWN STRL REUS W/ TWL LRG LVL3 (GOWN DISPOSABLE) ×2 IMPLANT
GOWN STRL REUS W/TWL LRG LVL3 (GOWN DISPOSABLE) ×2
K-WIRE DBL END TROCAR 6X.045 (WIRE) ×2
K-WIRE DBL END TROCAR 6X.062 (WIRE) ×2
KIT TURNOVER KIT A (KITS) ×2 IMPLANT
KWIRE DBL END TROCAR 6X.045 (WIRE) IMPLANT
KWIRE DBL END TROCAR 6X.062 (WIRE) IMPLANT
NS IRRIG 500ML POUR BTL (IV SOLUTION) ×2 IMPLANT
PACK EXTREMITY ARMC (MISCELLANEOUS) ×2 IMPLANT
PIN BALLS 3/8 F/.045 WIRE (MISCELLANEOUS) ×2 IMPLANT
RASP SM TEAR CROSS CUT (RASP) ×1 IMPLANT
STOCKINETTE IMPERVIOUS LG (DRAPES) ×2 IMPLANT
STRAP BODY AND KNEE 60X3 (MISCELLANEOUS) ×2 IMPLANT
STRIP CLOSURE SKIN 1/4X4 (GAUZE/BANDAGES/DRESSINGS) ×2 IMPLANT
SUT MNCRL+ 5-0 UNDYED PC-3 (SUTURE) IMPLANT
SUT MONOCRYL 5-0 (SUTURE) ×1
SUT VIC AB 3-0 SH 27 (SUTURE) ×1
SUT VIC AB 3-0 SH 27X BRD (SUTURE) IMPLANT
SUT VIC AB 4-0 FS2 27 (SUTURE) ×1 IMPLANT

## 2018-02-26 NOTE — Transfer of Care (Signed)
Immediate Anesthesia Transfer of Care Note  Patient: Donna Powell  Procedure(s) Performed: HALLUX VALGUS [place]IN (Right Foot)  Patient Location: PACU  Anesthesia Type: General  Level of Consciousness: awake, alert  and patient cooperative  Airway and Oxygen Therapy: Patient Spontanous Breathing and Patient connected to supplemental oxygen  Post-op Assessment: Post-op Vital signs reviewed, Patient's Cardiovascular Status Stable, Respiratory Function Stable, Patent Airway and No signs of Nausea or vomiting  Post-op Vital Signs: Reviewed and stable  Complications: No apparent anesthesia complications

## 2018-02-26 NOTE — Op Note (Signed)
Operative note   Surgeon:Dhanvi Boesen    Assistant:none    Preop diagnosis: Right foot hallux valgus deformity    Postop diagnosis: Same    Procedure: [place] hallux valgus correction right foot    EBL: Minimal    Anesthesia:local and general.  Local consisted of a one-to-one mixture of 0.25% bupivacaine and Exparel long-acting anesthetic.  A total of 20 cc was used in a Mayo block fashion.    Hemostasis: Ankle tourniquet inflated to 200 mmHg for 33 minutes    Specimen: None    Complications: None    Operative indications:Donna Powell is an 67 y.o. that presents today for surgical intervention.  The risks/benefits/alternatives/complications have been discussed and consent has been given.    Procedure:  Patient was brought into the OR and placed on the operating table in thesupine position. After anesthesia was obtained theright lower extremity was prepped and draped in usual sterile fashion.  Attention was directed to the dorsomedial right first MTPJ where a dorsal medial incision was performed.  Sharp and blunt dissection carried down to the capsule.  A T capsulotomy was performed.  The dorsomedial eminence was noted and transected with a power saw.  A V osteotomy was created and the capital fragment was translocated laterally.  This was then stabilized with a 0.062 K wire.  Good realignment was noted of the first MTPJ.  The ensuing overhanging ledge was transected with a power saw.  At this point good realignment of the first metatarsal phalangeal joint was noted without compression between the great toe and second toe.  I decided not to perform the Akin osteotomy at this time to avoid overcorrection.  A small capsulorrhaphy was performed medially.  Closure was performed after irrigation of the wound with copious irrigation.  3-0 Vicryl for the capsular layer, 4-0 Vicryl for the subtenons tissue and 5-0 Monocryl undyed for skin.    Patient tolerated the procedure and anesthesia  well.  Was transported from the OR to the PACU with all vital signs stable and vascular status intact. To be discharged per routine protocol.  Will follow up in approximately 1 week in the outpatient clinic.

## 2018-02-26 NOTE — Anesthesia Postprocedure Evaluation (Signed)
Anesthesia Post Note  Patient: Donna Powell  Procedure(s) Performed: [place] (Right Foot)  Patient location during evaluation: PACU Anesthesia Type: General Level of consciousness: awake and alert Pain management: pain level controlled Vital Signs Assessment: post-procedure vital signs reviewed and stable Respiratory status: spontaneous breathing, nonlabored ventilation, respiratory function stable and patient connected to nasal cannula oxygen Cardiovascular status: blood pressure returned to baseline and stable Postop Assessment: no apparent nausea or vomiting Anesthetic complications: no    Marceline Napierala

## 2018-02-26 NOTE — Anesthesia Preprocedure Evaluation (Signed)
Anesthesia Evaluation  Patient identified by MRN, date of birth, ID band  Reviewed: NPO status   History of Anesthesia Complications Negative for: history of anesthetic complications  Airway Mallampati: II  TM Distance: >3 FB Neck ROM: full    Dental no notable dental hx.    Pulmonary neg pulmonary ROS, former smoker,    Pulmonary exam normal        Cardiovascular Exercise Tolerance: Good negative cardio ROS Normal cardiovascular exam     Neuro/Psych negative neurological ROS  negative psych ROS   GI/Hepatic Neg liver ROS, GERD  Controlled,  Endo/Other  negative endocrine ROS  Renal/GU negative Renal ROS  negative genitourinary   Musculoskeletal  (+) Arthritis ,   Abdominal   Peds  Hematology negative hematology ROS (+)   Anesthesia Other Findings pcp stable: 02/2018: dr. Lovie Macadamia;    Reproductive/Obstetrics                             Anesthesia Physical Anesthesia Plan  ASA: II  Anesthesia Plan: General   Post-op Pain Management:    Induction:   PONV Risk Score and Plan:   Airway Management Planned:   Additional Equipment:   Intra-op Plan:   Post-operative Plan:   Informed Consent: I have reviewed the patients History and Physical, chart, labs and discussed the procedure including the risks, benefits and alternatives for the proposed anesthesia with the patient or authorized representative who has indicated his/her understanding and acceptance.     Plan Discussed with: CRNA  Anesthesia Plan Comments:         Anesthesia Quick Evaluation

## 2018-02-26 NOTE — Anesthesia Procedure Notes (Signed)
Procedure Name: LMA Insertion Date/Time: 02/26/2018 12:12 PM Performed by: Janna Arch, CRNA Pre-anesthesia Checklist: Patient identified, Emergency Drugs available, Suction available and Patient being monitored Patient Re-evaluated:Patient Re-evaluated prior to induction Oxygen Delivery Method: Circle system utilized Preoxygenation: Pre-oxygenation with 100% oxygen Induction Type: IV induction Ventilation: Mask ventilation without difficulty LMA: LMA inserted LMA Size: 4.0 Number of attempts: 1 Tube secured with: Tape Dental Injury: Teeth and Oropharynx as per pre-operative assessment

## 2018-02-26 NOTE — H&P (Signed)
HISTORY AND PHYSICAL INTERVAL NOTE:  02/26/2018  11:57 AM  Donna Powell  has presented today for surgery, with the diagnosis of Hallux Valgus.  The various methods of treatment have been discussed with the patient.  No guarantees were given.  After consideration of risks, benefits and other options for treatment, the patient has consented to surgery.  I have reviewed the patients' chart and labs.    Patient Vitals for the past 24 hrs:  BP Temp Temp src Pulse Resp SpO2 Height Weight  02/26/18 1050 133/77 (!) 97.3 F (36.3 C) Temporal 66 16 100 % 5\' 4"  (1.626 m) 64.4 kg (142 lb)    A history and physical examination was performed in my office.  The patient was reexamined.  There have been no changes to this history and physical examination.  Samara Deist A

## 2018-02-27 ENCOUNTER — Encounter: Payer: Self-pay | Admitting: Podiatry

## 2018-03-04 DIAGNOSIS — M2011 Hallux valgus (acquired), right foot: Secondary | ICD-10-CM | POA: Diagnosis not present

## 2018-03-20 DIAGNOSIS — M2011 Hallux valgus (acquired), right foot: Secondary | ICD-10-CM | POA: Diagnosis not present

## 2018-04-09 DIAGNOSIS — M2011 Hallux valgus (acquired), right foot: Secondary | ICD-10-CM | POA: Diagnosis not present

## 2018-05-14 DIAGNOSIS — J019 Acute sinusitis, unspecified: Secondary | ICD-10-CM | POA: Diagnosis not present

## 2018-05-27 DIAGNOSIS — M79671 Pain in right foot: Secondary | ICD-10-CM | POA: Diagnosis not present

## 2018-05-27 DIAGNOSIS — T85848S Pain due to other internal prosthetic devices, implants and grafts, sequela: Secondary | ICD-10-CM | POA: Diagnosis not present

## 2018-05-28 DIAGNOSIS — M79605 Pain in left leg: Secondary | ICD-10-CM | POA: Diagnosis not present

## 2018-05-28 DIAGNOSIS — M79604 Pain in right leg: Secondary | ICD-10-CM | POA: Diagnosis not present

## 2018-06-05 DIAGNOSIS — T63441A Toxic effect of venom of bees, accidental (unintentional), initial encounter: Secondary | ICD-10-CM | POA: Diagnosis not present

## 2018-06-06 DIAGNOSIS — T63441A Toxic effect of venom of bees, accidental (unintentional), initial encounter: Secondary | ICD-10-CM | POA: Diagnosis not present

## 2018-06-23 DIAGNOSIS — M4602 Spinal enthesopathy, cervical region: Secondary | ICD-10-CM | POA: Diagnosis not present

## 2018-06-23 DIAGNOSIS — M9901 Segmental and somatic dysfunction of cervical region: Secondary | ICD-10-CM | POA: Diagnosis not present

## 2018-07-27 DIAGNOSIS — J019 Acute sinusitis, unspecified: Secondary | ICD-10-CM | POA: Diagnosis not present

## 2018-08-11 DIAGNOSIS — M9901 Segmental and somatic dysfunction of cervical region: Secondary | ICD-10-CM | POA: Diagnosis not present

## 2018-08-11 DIAGNOSIS — M4602 Spinal enthesopathy, cervical region: Secondary | ICD-10-CM | POA: Diagnosis not present

## 2018-08-27 DIAGNOSIS — M79605 Pain in left leg: Secondary | ICD-10-CM | POA: Diagnosis not present

## 2018-08-27 DIAGNOSIS — I8393 Asymptomatic varicose veins of bilateral lower extremities: Secondary | ICD-10-CM | POA: Diagnosis not present

## 2018-08-27 DIAGNOSIS — Z23 Encounter for immunization: Secondary | ICD-10-CM | POA: Diagnosis not present

## 2018-08-27 DIAGNOSIS — M79604 Pain in right leg: Secondary | ICD-10-CM | POA: Diagnosis not present

## 2018-09-22 ENCOUNTER — Encounter (INDEPENDENT_AMBULATORY_CARE_PROVIDER_SITE_OTHER): Payer: Self-pay | Admitting: Vascular Surgery

## 2018-09-22 ENCOUNTER — Ambulatory Visit (INDEPENDENT_AMBULATORY_CARE_PROVIDER_SITE_OTHER): Payer: PPO | Admitting: Vascular Surgery

## 2018-09-22 VITALS — BP 138/84 | HR 71 | Resp 16 | Ht 65.0 in | Wt 141.2 lb

## 2018-09-22 DIAGNOSIS — Z87891 Personal history of nicotine dependence: Secondary | ICD-10-CM | POA: Diagnosis not present

## 2018-09-22 DIAGNOSIS — I83813 Varicose veins of bilateral lower extremities with pain: Secondary | ICD-10-CM

## 2018-09-22 DIAGNOSIS — R600 Localized edema: Secondary | ICD-10-CM

## 2018-09-23 ENCOUNTER — Encounter (INDEPENDENT_AMBULATORY_CARE_PROVIDER_SITE_OTHER): Payer: Self-pay | Admitting: Vascular Surgery

## 2018-09-23 DIAGNOSIS — R600 Localized edema: Secondary | ICD-10-CM | POA: Insufficient documentation

## 2018-09-23 DIAGNOSIS — I83813 Varicose veins of bilateral lower extremities with pain: Secondary | ICD-10-CM | POA: Insufficient documentation

## 2018-09-23 NOTE — Progress Notes (Signed)
Subjective:    Patient ID: Donna Powell, female    DOB: 1951/10/08, 67 y.o.   MRN: [id number] Chief Complaint  Patient presents with  . New Patient (Initial Visit)    ref Eulas Post efor spider veins   Presents as a new patient referred by Physician Assistant Eulas Post for evaluation of "venous disease".  The patient endorses a long-standing history of varicosities located to the bilateral legs however has noticed an increase in size, number and discomfort over the last "few years".  The patient also experiences bilateral lower extremity swelling which is also associated with discomfort.  The patient notes that her discomfort worsens with sitting and standing for long periods of time.  The patient notes that her symptoms have progressed to the point that she is unable to function on a daily basis and they have become lifestyle limiting.  At this time the patient does engage in conservative therapy including wearing medical grade 1 compression socks, elevating her legs and remaining active however this has provided minimal improvement to her symptoms.  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  Patient denies any recent surgery or trauma, DVT or recurrent bouts of cellulitis to the bilateral legs.  Patient denies any claudication-like symptoms, rest pain or ulcer formation to the bilateral lower extremity.  The patient denies any fever, nausea vomiting.  Review of Systems  Constitutional: Negative.   HENT: Negative.   Eyes: Negative.   Respiratory: Negative.   Cardiovascular: Positive for leg swelling.       Painful varicose veins  Gastrointestinal: Negative.   Endocrine: Negative.   Genitourinary: Negative.   Musculoskeletal: Negative.   Skin: Negative.   Allergic/Immunologic: Negative.   Neurological: Negative.   Hematological: Negative.   Psychiatric/Behavioral: Negative.       Objective:   Physical Exam  Constitutional: She is  oriented to person, place, and time. She appears well-developed and well-nourished. No distress.  HENT:  Head: Normocephalic and atraumatic.  Right Ear: External ear normal.  Left Ear: External ear normal.  Eyes: Pupils are equal, round, and reactive to light. Conjunctivae and EOM are normal.  Neck: Normal range of motion.  Cardiovascular: Normal rate, regular rhythm, normal heart sounds and intact distal pulses.  Pulses:      Radial pulses are 2+ on the right side, and 2+ on the left side.       Dorsalis pedis pulses are 2+ on the right side, and 2+ on the left side.       Posterior tibial pulses are 2+ on the right side, and 2+ on the left side.  Pulmonary/Chest: Effort normal and breath sounds normal.  Musculoskeletal: Normal range of motion. She exhibits edema (Mild bilateral lower extremity nonpitting edema noted).  Neurological: She is alert and oriented to person, place, and time.  Skin: Skin is warm and dry. She is not diaphoretic.  Diffuse less than 1 cm varicosities noted to the bilateral legs.  Is no stasis dermatitis, fibrosis, cellulitis or active ulcerations noted at this time.  Psychiatric: She has a normal mood and affect. Her behavior is normal. Judgment and thought content normal.  Vitals reviewed.  BP 138/84 (BP Location: Right Arm)   Pulse 71   Resp 16   Ht 5\' 5"  (1.651 m)   Wt 141 lb 3.2 oz (64 kg)   BMI 23.50 kg/m   Past Medical History:  Diagnosis Date  . Medical history non-contributory   .  Rheumatoid arthritis ([place])    Social History   Socioeconomic History  . Marital status: Married    Spouse name: Not on file  . Number of children: Not on file  . Years of education: Not on file  . Highest education level: Not on file  Occupational History  . Not on file  Social Needs  . Financial resource strain: Not on file  . Food insecurity:    Worry: Not on file    Inability: Not on file  . Transportation needs:    Medical: Not on file    Non-medical:  Not on file  Tobacco Use  . Smoking status: Former Research scientist (life sciences)  . Smokeless tobacco: Never Used  . Tobacco comment: quit about 10 yrs ago  Substance and Sexual Activity  . Alcohol use: No  . Drug use: Never  . Sexual activity: Not on file  Lifestyle  . Physical activity:    Days per week: Not on file    Minutes per session: Not on file  . Stress: Not on file  Relationships  . Social connections:    Talks on phone: Not on file    Gets together: Not on file    Attends religious service: Not on file    Active member of club or organization: Not on file    Attends meetings of clubs or organizations: Not on file    Relationship status: Not on file  . Intimate partner violence:    Fear of current or ex partner: Not on file    Emotionally abused: Not on file    Physically abused: Not on file    Forced sexual activity: Not on file  Other Topics Concern  . Not on file  Social History Narrative  . Not on file   Past Surgical History:  Procedure Laterality Date  . ABDOMINAL HYSTERECTOMY    . APPENDECTOMY    . HALLUX VALGUS AUSTIN Right 02/26/2018   Procedure: HALLUX VALGUS AUSTIN;  Surgeon: Samara Deist, DPM;  Location: [facility];  Service: Podiatry;  Laterality: Right;  GENERAL WITH LOCAL  . SHOULDER ARTHROSCOPY Right 11/30/2015   Procedure: Limited arthroscopic debridement, arthroscopic subacromial decompression, and mini-open rotator cuff repair, right shoulder. ;  Surgeon: Corky Mull, MD;  Location: [facility];  Service: Orthopedics;  Laterality: Right;   Family History  Problem Relation Age of Onset  . Hypertension Mother   . Varicose Veins Paternal Aunt   . Asthma Maternal Grandmother    Allergies  Allergen Reactions  . Aspirin Nausea And Vomiting      Assessment & Plan:  Presents as a new patient referred by Physician Assistant Eulas Post for evaluation of "venous disease".  The patient endorses a long-standing history of varicosities located to the  bilateral legs however has noticed an increase in size, number and discomfort over the last "few years".  The patient also experiences bilateral lower extremity swelling which is also associated with discomfort.  The patient notes that her discomfort worsens with sitting and standing for long periods of time.  The patient notes that her symptoms have progressed to the point that she is unable to function on a daily basis and they have become lifestyle limiting.  At this time the patient does engage in conservative therapy including wearing medical grade 1 compression socks, elevating her legs and remaining active however this has provided minimal improvement to her symptoms.  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  Patient denies any recent surgery or trauma, DVT or recurrent bouts of cellulitis to the bilateral legs.  Patient denies any claudication-like symptoms, rest pain or ulcer formation to the bilateral lower extremity.  The patient denies any fever, nausea vomiting.  1. Bilateral lower extremity edema - New The patient was encouraged to wear graduated compression stockings (20-30 mmHg) on a daily basis. The patient was instructed to begin wearing the stockings first thing in the morning and removing them in the evening. The patient was instructed specifically not to sleep in the stockings. Prescription given.  In addition, behavioral modification including elevation during the day will be initiated. Anti-inflammatories for pain. I will bring the patient back and have her undergo bilateral lower extremity venous duplex to rule out any contributing venous versus lymphatic disease The patient will follow up in one months to asses conservative management.  Information on compression stockings was given to the patient. The patient was instructed to call the office in the interim if any worsening edema or ulcerations to the legs, feet or toes  occurs. The patient expresses their understanding.  - VAS [place] LOWER EXTREMITY VENOUS REFLUX; Future  2. Varicose veins of both lower extremities with pain - New As above  - VAS [place] LOWER EXTREMITY VENOUS REFLUX; Future  Current Outpatient Medications on File Prior to Visit  Medication Sig Dispense Refill  . CALCIUM PO Take by mouth.    . celecoxib (CELEBREX) 100 MG capsule Take 100 mg by mouth 2 (two) times daily.    . Multiple Vitamin (MULTIVITAMIN) tablet Take 1 tablet by mouth daily.    Marland Kitchen omeprazole (PRILOSEC) 20 MG capsule Take 20 mg by mouth as needed.    Marland Kitchen oxyCODONE (ROXICODONE) 5 MG immediate release tablet Take 1-2 tablets (5-10 mg total) by mouth every 4 (four) hours as needed for severe pain. (Patient not taking: Reported on 02/18/2018) 60 tablet 0  . oxyCODONE-acetaminophen (PERCOCET) 5-325 MG tablet Take 1 tablet by mouth every 4 (four) hours as needed for severe pain. (Patient not taking: Reported on 09/22/2018) 20 tablet 0  . oxyCODONE-acetaminophen (PERCOCET) 5-325 MG tablet Take 1 tablet by mouth every 4 (four) hours as needed for severe pain. (Patient not taking: Reported on 09/22/2018) 20 tablet 0   No current facility-administered medications on file prior to visit.    There are no Patient Instructions on file for this visit. Return in about 1 month (around 10/22/2018), or if symptoms worsen or fail to improve.  Esteen Delpriore A Allyn Bartelson, PA-C

## 2018-10-20 DIAGNOSIS — M9903 Segmental and somatic dysfunction of lumbar region: Secondary | ICD-10-CM | POA: Diagnosis not present

## 2018-10-20 DIAGNOSIS — M542 Cervicalgia: Secondary | ICD-10-CM | POA: Diagnosis not present

## 2018-10-20 DIAGNOSIS — M545 Low back pain: Secondary | ICD-10-CM | POA: Diagnosis not present

## 2018-10-20 DIAGNOSIS — M9901 Segmental and somatic dysfunction of cervical region: Secondary | ICD-10-CM | POA: Diagnosis not present

## 2018-10-21 ENCOUNTER — Other Ambulatory Visit (INDEPENDENT_AMBULATORY_CARE_PROVIDER_SITE_OTHER): Payer: Self-pay | Admitting: Vascular Surgery

## 2018-10-21 DIAGNOSIS — M7989 Other specified soft tissue disorders: Principal | ICD-10-CM

## 2018-10-21 DIAGNOSIS — M79669 Pain in unspecified lower leg: Secondary | ICD-10-CM

## 2018-10-23 ENCOUNTER — Encounter (INDEPENDENT_AMBULATORY_CARE_PROVIDER_SITE_OTHER): Payer: Self-pay | Admitting: Vascular Surgery

## 2018-10-23 ENCOUNTER — Ambulatory Visit (INDEPENDENT_AMBULATORY_CARE_PROVIDER_SITE_OTHER): Payer: PPO | Admitting: Vascular Surgery

## 2018-10-23 ENCOUNTER — Ambulatory Visit (INDEPENDENT_AMBULATORY_CARE_PROVIDER_SITE_OTHER): Payer: PPO

## 2018-10-23 VITALS — BP 129/82 | HR 68 | Resp 17 | Wt 141.0 lb

## 2018-10-23 DIAGNOSIS — R600 Localized edema: Secondary | ICD-10-CM

## 2018-10-23 DIAGNOSIS — I83813 Varicose veins of bilateral lower extremities with pain: Secondary | ICD-10-CM

## 2018-10-23 DIAGNOSIS — M79604 Pain in right leg: Secondary | ICD-10-CM | POA: Diagnosis not present

## 2018-10-23 DIAGNOSIS — M79669 Pain in unspecified lower leg: Secondary | ICD-10-CM | POA: Diagnosis not present

## 2018-10-23 DIAGNOSIS — M79605 Pain in left leg: Secondary | ICD-10-CM

## 2018-10-23 DIAGNOSIS — M7989 Other specified soft tissue disorders: Principal | ICD-10-CM

## 2018-10-26 ENCOUNTER — Encounter (INDEPENDENT_AMBULATORY_CARE_PROVIDER_SITE_OTHER): Payer: Self-pay | Admitting: Vascular Surgery

## 2018-10-26 DIAGNOSIS — M79606 Pain in leg, unspecified: Secondary | ICD-10-CM | POA: Insufficient documentation

## 2018-10-26 NOTE — Progress Notes (Signed)
MRN : [id number]  Donna Powell is a 67 y.o. (1951/09/04) female who presents with chief complaint of  Chief Complaint  Patient presents with  . Follow-up    19month bil venous reflux and abi  .  History of Present Illness:  The patient returns to the office for followup evaluation regarding leg pain and swelling.  The swelling has improved quite a bit with compression and elevation.  Also the pain associated with swelling has decreased substantially. There have not been any interval development of a ulcerations or wounds.  Since the previous visit the patient has been wearing graduated compression stockings and has noted little significant improvement in the lymphedema. The patient has been using compression routinely morning until night.  The patient also states elevation during the day and exercise is being done too.  Venous Duplex shows a normal deep system bilaterally no significant superficial reflux identified. ABI's are normal bilaterally   Current Meds  Medication Sig  . CALCIUM PO Take by mouth.  . celecoxib (CELEBREX) 100 MG capsule Take 100 mg by mouth 2 (two) times daily.  . Multiple Vitamin (MULTIVITAMIN) tablet Take 1 tablet by mouth daily.  Marland Kitchen omeprazole (PRILOSEC) 20 MG capsule Take 20 mg by mouth as needed.    Past Medical History:  Diagnosis Date  . Medical history non-contributory   . Rheumatoid arthritis [facility])     Past Surgical History:  Procedure Laterality Date  . ABDOMINAL HYSTERECTOMY    . APPENDECTOMY    . HALLUX VALGUS [place]IN Right 02/26/2018   Procedure: HALLUX VALGUS [place];  Surgeon: Samara Deist, DPM;  Location: [facility];  Service: Podiatry;  Laterality: Right;  GENERAL WITH LOCAL  . SHOULDER ARTHROSCOPY Right 11/30/2015   Procedure: Limited arthroscopic debridement, arthroscopic subacromial decompression, and mini-open rotator cuff repair, right shoulder. ;  Surgeon: Corky Mull, MD;  Location: [facility];  Service:  Orthopedics;  Laterality: Right;    Social History Social History   Tobacco Use  . Smoking status: Former Research scientist (life sciences)  . Smokeless tobacco: Never Used  . Tobacco comment: quit about 10 yrs ago  Substance Use Topics  . Alcohol use: No  . Drug use: Never    Family History Family History  Problem Relation Age of Onset  . Hypertension Mother   . Varicose Veins Paternal Aunt   . Asthma Maternal Grandmother     Allergies  Allergen Reactions  . Aspirin Nausea And Vomiting     REVIEW OF SYSTEMS (Negative unless checked)  Constitutional: [] Weight loss  [] Fever  [] Chills Cardiac: [] Chest pain   [] Chest pressure   [] Palpitations   [] Shortness of breath when laying flat   [] Shortness of breath with exertion. Vascular:  [] Pain in legs with walking   [x] Pain in legs with prolonged standing  [] History of DVT   [] Phlebitis   [x] Swelling in legs   [x] Varicose veins   [] Non-healing ulcers Pulmonary:   [] Uses home oxygen   [] Productive cough   [] Hemoptysis   [] Wheeze  [] COPD   [] Asthma Neurologic:  [] Dizziness   [] Seizures   [] History of stroke   [] History of TIA  [] Aphasia   [] Vissual changes   [] Weakness or numbness in arm   [] Weakness or numbness in leg Musculoskeletal:   [] Joint swelling   [] Joint pain   [] Low back pain Hematologic:  [] Easy bruising  [] Easy bleeding   [] Hypercoagulable state   [] Anemic Gastrointestinal:  [] Diarrhea   [] Vomiting  [] Gastroesophageal reflux/heartburn   [] Difficulty swallowing. Genitourinary:  []   Chronic kidney disease   [] Difficult urination  [] Frequent urination   [] Blood in urine Skin:  [] Rashes   [] Ulcers  Psychological:  [] History of anxiety   []  History of major depression.  Physical Examination  Vitals:   10/23/18 1539  BP: 129/82  Pulse: 68  Resp: 17  Weight: 141 lb (64 kg)   Body mass index is 23.46 kg/m. Gen: WD/WN, NAD Head: [place]/AT, No temporalis wasting.  Ear/Nose/Throat: Hearing grossly intact, nares w/o erythema or drainage Eyes: PER,  EOMI, sclera nonicteric.  Neck: Supple, no large masses.   Pulmonary:  Good air movement, no audible wheezing bilaterally, no use of accessory muscles.  Cardiac: RRR, no JVD Vascular: scattered varicosities present bilaterally.  Mild venous stasis changes to the legs bilaterally.  2+ soft pitting edema Vessel Right Left  Radial Palpable Palpable  PT Trace Palpable Trace Palpable  DP Trace Palpable Trace Palpable  Gastrointestinal: Non-distended. No guarding/no peritoneal signs.  Musculoskeletal: M/S 5/5 throughout.  No deformity or atrophy.  Neurologic: CN 2-12 intact. Symmetrical.  Speech is fluent. Motor exam as listed above. Psychiatric: Judgment intact, Mood & affect appropriate for pt's clinical situation. Dermatologic: Mild venous rashes no ulcers noted.  No changes consistent with cellulitis. Lymph : No lichenification or skin changes of chronic lymphedema.  CBC Lab Results  Component Value Date   WBC 3.2 (L) 01/19/2014   HGB 13.0 01/19/2014   HCT 39.5 01/19/2014   MCV 86 01/19/2014   PLT 169 01/19/2014    BMET    Component Value Date/Time   NA 138 01/19/2014 1036   K 3.7 01/19/2014 1036   CL 104 01/19/2014 1036   CO2 32 01/19/2014 1036   GLUCOSE 82 01/19/2014 1036   BUN 7 01/19/2014 1036   CREATININE 0.87 01/19/2014 1036   CALCIUM 8.1 (L) 01/19/2014 1036   GFRNONAA >60 01/19/2014 1036   GFRAA >60 01/19/2014 1036   CrCl cannot be calculated (Patient's most recent lab result is older than the maximum 21 days allowed.).  COAG No results found for: INR, PROTIME  Radiology No results found.  Assessment/Plan 1. Varicose veins of both lower extremities with pain Recommend:  The patient is complaining of varicose veins.    I have had a long discussion with the patient regarding  varicose veins and why they cause symptoms.  Patient will begin wearing graduated compression stockings on a daily basis, beginning first thing in the morning and removing them in the  evening. The patient is instructed specifically not to sleep in the stockings.    The patient  will also begin using over-the-counter analgesics such as Motrin 600 mg po TID to help control the symptoms as needed.    In addition, behavioral modification including elevation during the day will be initiated, utilizing a recliner was recommended.  The patient is also instructed to continue exercising such as walking 4-5 times per week.  At this time the patient wishes to continue conservative therapy and is not interested in more invasive treatments such as laser ablation and sclerotherapy.  The Patient will follow up PRN if the symptoms worsen.  2. Bilateral lower extremity edema Recommend:  The patient is complaining of varicose veins.    I have had a long discussion with the patient regarding  varicose veins and why they cause symptoms.  Patient will begin wearing graduated compression stockings on a daily basis, beginning first thing in the morning and removing them in the evening. The patient is instructed specifically not to  sleep in the stockings.    The patient  will also begin using over-the-counter analgesics such as Motrin 600 mg po TID to help control the symptoms as needed.    In addition, behavioral modification including elevation during the day will be initiated, utilizing a recliner was recommended.  The patient is also instructed to continue exercising such as walking 4-5 times per week.  At this time the patient wishes to continue conservative therapy and is not interested in more invasive treatments such as laser ablation and sclerotherapy.  The Patient will follow up PRN if the symptoms worsen.  3. Pain in both lower extremities Recommend:  The patient is complaining of varicose veins.    I have had a long discussion with the patient regarding  varicose veins and why they cause symptoms.  Patient will begin wearing graduated compression stockings on a daily basis,  beginning first thing in the morning and removing them in the evening. The patient is instructed specifically not to sleep in the stockings.    The patient  will also begin using over-the-counter analgesics such as Motrin 600 mg po TID to help control the symptoms as needed.    In addition, behavioral modification including elevation during the day will be initiated, utilizing a recliner was recommended.  The patient is also instructed to continue exercising such as walking 4-5 times per week.  At this time the patient wishes to continue conservative therapy and is not interested in more invasive treatments such as laser ablation and sclerotherapy.  The Patient will follow up PRN if the symptoms worsen.    Hortencia Pilar, MD  10/26/2018 3:46 PM

## 2019-03-16 DIAGNOSIS — K219 Gastro-esophageal reflux disease without esophagitis: Secondary | ICD-10-CM | POA: Diagnosis not present

## 2019-03-16 DIAGNOSIS — R413 Other amnesia: Secondary | ICD-10-CM | POA: Diagnosis not present

## 2019-03-16 DIAGNOSIS — Z0000 Encounter for general adult medical examination without abnormal findings: Secondary | ICD-10-CM | POA: Diagnosis not present

## 2019-03-16 DIAGNOSIS — E7800 Pure hypercholesterolemia, unspecified: Secondary | ICD-10-CM | POA: Diagnosis not present

## 2019-03-16 DIAGNOSIS — M8580 Other specified disorders of bone density and structure, unspecified site: Secondary | ICD-10-CM | POA: Diagnosis not present

## 2019-03-16 DIAGNOSIS — R252 Cramp and spasm: Secondary | ICD-10-CM | POA: Diagnosis not present

## 2019-03-18 ENCOUNTER — Other Ambulatory Visit: Payer: Self-pay | Admitting: Family Medicine

## 2019-03-18 DIAGNOSIS — Z1231 Encounter for screening mammogram for malignant neoplasm of breast: Secondary | ICD-10-CM

## 2019-05-25 ENCOUNTER — Other Ambulatory Visit: Payer: Self-pay

## 2019-05-25 ENCOUNTER — Ambulatory Visit
Admission: RE | Admit: 2019-05-25 | Discharge: 2019-05-25 | Disposition: A | Discharge status: Home / Self Care | Payer: PPO | Source: Ambulatory Visit | Attending: Family Medicine | Admitting: Family Medicine

## 2019-05-25 DIAGNOSIS — Z1231 Encounter for screening mammogram for malignant neoplasm of breast: Secondary | ICD-10-CM | POA: Insufficient documentation

## 2019-09-16 DIAGNOSIS — Z23 Encounter for immunization: Secondary | ICD-10-CM | POA: Diagnosis not present

## 2019-10-17 DIAGNOSIS — S2341XA Sprain of ribs, initial encounter: Secondary | ICD-10-CM | POA: Diagnosis not present

## 2019-10-23 DIAGNOSIS — R109 Unspecified abdominal pain: Secondary | ICD-10-CM | POA: Diagnosis not present

## 2019-11-06 ENCOUNTER — Emergency Department
Admission: EM | Admit: 2019-11-06 | Discharge: 2019-11-06 | Disposition: A | Discharge status: Home / Self Care | Payer: PPO | Attending: Emergency Medicine | Admitting: Emergency Medicine

## 2019-11-06 ENCOUNTER — Encounter: Payer: Self-pay | Admitting: Emergency Medicine

## 2019-11-06 ENCOUNTER — Other Ambulatory Visit: Payer: Self-pay

## 2019-11-06 DIAGNOSIS — Z79899 Other long term (current) drug therapy: Secondary | ICD-10-CM | POA: Insufficient documentation

## 2019-11-06 DIAGNOSIS — Z87891 Personal history of nicotine dependence: Secondary | ICD-10-CM | POA: Insufficient documentation

## 2019-11-06 DIAGNOSIS — M069 Rheumatoid arthritis, unspecified: Secondary | ICD-10-CM | POA: Diagnosis not present

## 2019-11-06 DIAGNOSIS — U071 COVID-19: Secondary | ICD-10-CM | POA: Diagnosis not present

## 2019-11-06 DIAGNOSIS — J029 Acute pharyngitis, unspecified: Secondary | ICD-10-CM

## 2019-11-06 DIAGNOSIS — Z20822 Contact with and (suspected) exposure to covid-19: Secondary | ICD-10-CM

## 2019-11-06 LAB — SARS CORONAVIRUS 2 (TAT 6-24 HRS): SARS Coronavirus 2: POSITIVE — AB

## 2019-11-06 NOTE — Discharge Instructions (Signed)
Contact your primary care provider if any continued problems or concerns.  Your Covid test should have results in less than 24 hours.  Continue to drink fluids.  You are quarantined until you receive the results of your test.  You may take Tylenol as needed for throat pain or muscle aches.  Should you develop difficulty breathing return to the emergency department immediately.

## 2019-11-06 NOTE — ED Triage Notes (Signed)
Pt here with c/o sore throat since yesterday, husband is covid +. Denies any other complaints. NAD.

## 2019-11-06 NOTE — ED Notes (Signed)
Pt verbalized understanding of discharge instructions. NAD at this time. 

## 2019-11-06 NOTE — ED Provider Notes (Addendum)
[facility] Emergency Department Provider Note  ____________________________________________   First MD Initiated Contact with Patient 11/06/19 [phone]     (approximate)  I have reviewed the triage vital signs and the nursing notes.   HISTORY  Chief Complaint Sore Throat   HPI Donna Powell is a 68 y.o. female presents to the ED with complaint of sore throat that began last evening.  Patient states that she is unaware of any fever but does endorse increased pain with swallowing.  She continues to drink and eat as normal.  She denies any changes in her smell or taste.  There is been no history of nausea, vomiting or diarrhea.  Patient reports that her husband was diagnosed with Covid 2 days ago.  She states that he had symptoms for approximately 1 week before he was diagnosed.  Rates her pain as an 8 out of 10.         Past Medical History:  Diagnosis Date  . Medical history non-contributory   . Rheumatoid arthritis [facility])     Patient Active Problem List   Diagnosis Date Noted  . Leg pain 10/26/2018  . Bilateral lower extremity edema 09/23/2018  . Varicose veins of both lower extremities with pain 09/23/2018    Past Surgical History:  Procedure Laterality Date  . ABDOMINAL HYSTERECTOMY    . APPENDECTOMY    . HALLUX VALGUS AUSTIN Right 02/26/2018   Procedure: HALLUX VALGUS AUSTIN;  Surgeon: Samara Deist, DPM;  Location: [place];  Service: Podiatry;  Laterality: Right;  GENERAL WITH LOCAL  . SHOULDER ARTHROSCOPY Right 11/30/2015   Procedure: Limited arthroscopic debridement, arthroscopic subacromial decompression, and mini-open rotator cuff repair, right shoulder. ;  Surgeon: Corky Mull, MD;  Location: [place];  Service: Orthopedics;  Laterality: Right;    Prior to Admission medications   Medication Sig Start Date End Date Taking? Authorizing Provider  CALCIUM PO Take by mouth.    [provider]  celecoxib  (CELEBREX) 100 MG capsule Take 100 mg by mouth 2 (two) times daily.    [provider]  Multiple Vitamin (MULTIVITAMIN) tablet Take 1 tablet by mouth daily.    [provider]  omeprazole (PRILOSEC) 20 MG capsule Take 20 mg by mouth as needed.    [provider]  oxyCODONE (ROXICODONE) 5 MG immediate release tablet Take 1-2 tablets (5-10 mg total) by mouth every 4 (four) hours as needed for severe pain. Patient not taking: Reported on 02/18/2018 11/30/15   Poggi, Marshall Cork, MD    Allergies Aspirin  Family History  Problem Relation Age of Onset  . Hypertension Mother   . Varicose Veins Paternal Aunt   . Asthma Maternal Grandmother     Social History Social History   Tobacco Use  . Smoking status: Former Research scientist (life sciences)  . Smokeless tobacco: Never Used  . Tobacco comment: quit about 10 yrs ago  Substance Use Topics  . Alcohol use: No  . Drug use: Never    Review of Systems Constitutional: No fever/chills Eyes: No visual changes. ENT: Positive for sore throat. Cardiovascular: Denies chest pain. Respiratory: Denies shortness of breath.  Negative for cough. Gastrointestinal: No abdominal pain.  No nausea, no vomiting.  No diarrhea.  Genitourinary: Negative for dysuria. Musculoskeletal: Negative muscle aches. Skin: Negative for rash. Neurological: Negative for headaches, focal weakness or numbness. ____________________________________________   PHYSICAL EXAM:  VITAL SIGNS: ED Triage Vitals  Enc Vitals Group     BP 11/06/19 0809 Marland Kitchen)  152/84     Pulse Rate 11/06/19 0809 77     Resp 11/06/19 0809 16     Temp 11/06/19 0809 98.3 F (36.8 C)     Temp Source 11/06/19 0809 Oral     SpO2 11/06/19 0809 98 %     Weight 11/06/19 0809 142 lb (64.4 kg)     Height 11/06/19 0809 5\' 5"  (1.651 m)     Head Circumference --      Peak Flow --      Pain Score 11/06/19 0833 8     Pain Loc --      Pain Edu? --      Excl. in [place]? --    Constitutional: Alert and oriented.  Well appearing and in no acute distress.  Nontoxic in appearance and afebrile.  O2 sat 98%.. Eyes: Conjunctivae are normal.  Head: Atraumatic. Nose: No congestion/rhinnorhea.  EACs and TMs are clear. Mouth/Throat: Mucous membranes are moist.  Oropharynx non-erythematous.  Uvula is midline and no tonsillar exudate or enlargement noted.  Patient is able to swallow without any difficulty.  Is able to talk without any difficulty or shortness of breath. Neck: No stridor.   Hematological/Lymphatic/Immunilogical: No cervical lymphadenopathy. Cardiovascular: Normal rate, regular rhythm. Grossly normal heart sounds.  Good peripheral circulation. Respiratory: Normal respiratory effort.  No retractions. Lungs CTAB. Gastrointestinal: Soft and nontender. No distention.  Musculoskeletal: Moves upper and lower extremities any difficulty.  Normal gait was noted. Neurologic:  Normal speech and language. No gross focal neurologic deficits are appreciated. No gait instability. Skin:  Skin is warm, dry and intact. No rash noted. Psychiatric: Mood and affect are normal. Speech and behavior are normal.  ____________________________________________   LABS (all labs ordered are listed, but only abnormal results are displayed)  Labs Reviewed  SARS CORONAVIRUS 2 (TAT 6-24 HRS)    PROCEDURES  Procedure(s) performed (including Critical Care):  Procedures   ____________________________________________   INITIAL IMPRESSION / ASSESSMENT AND PLAN / ED COURSE  As part of my medical decision making, I reviewed the following data within the electronic MEDICAL RECORD NUMBER Notes from prior ED visits and [place] Controlled Substance Database  [name] was evaluated in Emergency Department on 11/06/2019 for the symptoms described in the history of present illness. She was evaluated in the context of the global COVID-19 pandemic, which necessitated consideration that the patient might be at risk for infection with the  SARS-CoV-2 virus that causes COVID-19. Institutional protocols and algorithms that pertain to the evaluation of patients at risk for COVID-19 are in a state of rapid change based on information released by regulatory bodies including the CDC and federal and state organizations. These policies and algorithms were followed during the patient's care in the ED.  68 year old female presents to the ED with onset of sore throat last evening.  Patient denies any fever, chills, nausea or vomiting.  There is been no change in taste or smell.  Patient denies cough or body aches.  She states that 2 days ago her husband was diagnosed with Covid here at [facility].  She states that she was around him for approximately 1 week with symptoms prior to his diagnosis.  On physical exam there is no erythema or posterior drainage noted with her throat.  Lungs were clear.  Patient was in no acute distress and was walking without any difficulty or assistance.  Patient most likely has Covid as she was closely exposed to her husband who is reported positive.  Patient is  aware that she needs to quarantine.  She will increase fluids and take Tylenol if needed.  She is aware that she will need to return to the emergency department if she develops any shortness of breath or difficulty breathing. ____________________________________________   FINAL CLINICAL IMPRESSION(S) / ED DIAGNOSES  Final diagnoses:  Acute pharyngitis, unspecified etiology  Close exposure to COVID-19 virus     ED Discharge Orders    None       Note:  This document was prepared using Dragon voice recognition software and may include unintentional dictation errors.    Johnn Hai, PA-C 11/06/19 1427    Johnn Hai, PA-C 11/06/19 1427    Harvest Dark, MD 11/06/19 [phone]

## 2019-11-07 ENCOUNTER — Telehealth: Payer: Self-pay | Admitting: Unknown Physician Specialty

## 2019-11-07 NOTE — Telephone Encounter (Signed)
Called to discuss with patient about Covid symptoms and the use of bamlanivimab, a monoclonal antibody infusion for those with mild to moderate Covid symptoms and at a high risk of hospitalization.  Pt is qualified for this infusion at the [facility] due to Age > 65   Message left to call back  

## 2019-11-08 ENCOUNTER — Telehealth: Payer: Self-pay

## 2019-11-08 NOTE — Telephone Encounter (Signed)
Pt given Covid-19 positive results. Discussed mild, moderate and severe symptoms. Advised pt to call 911 for any respiratory issues and/dehydration. Discussed non test criteria for ending self isolation. Pt advised of way to manage symptoms at home and review isolation precautions especially the importance of washing hands frequently and wearing a mask when around others. Pt verbalized understanding. Will report to HD. Enrolled pt into MyChart

## 2019-12-30 ENCOUNTER — Other Ambulatory Visit: Payer: Self-pay | Admitting: Podiatry

## 2019-12-30 DIAGNOSIS — M2012 Hallux valgus (acquired), left foot: Secondary | ICD-10-CM | POA: Diagnosis not present

## 2020-01-07 ENCOUNTER — Encounter: Payer: Self-pay | Admitting: Podiatry

## 2020-01-07 ENCOUNTER — Other Ambulatory Visit: Payer: Self-pay

## 2020-01-11 ENCOUNTER — Other Ambulatory Visit: Admission: RE | Admit: 2020-01-11 | Payer: PPO | Source: Ambulatory Visit

## 2020-01-11 NOTE — Discharge Instructions (Signed)
[facility]  POST OPERATIVE INSTRUCTIONS FOR DR. TROXLER, DR. FOWLER, AND DR. [facility] PODIATRY DEPART[facility]   1. Take your medication as prescribed.  Pain medication should be taken only as needed.  2. Keep the dressing clean, dry and intact.  3. Keep your foot elevated above the heart level for the first 48 hours.  4. Walking to the bathroom and brief periods of walking are acceptable, unless we have instructed you to be non-weight bearing.  5. Always wear your post-op shoe when walking.  Always use your crutches if you are to be non-weight bearing.  6. Do not take a shower. Baths are permissible as long as the foot is kept out of the water.   7. Every hour you are awake:  - Bend your knee 15 times. - Flex foot 15 times - Massage calf 15 times  8. Call [facility] ([phone]) if any of the following problems occur: - You develop a temperature or fever. - The bandage becomes saturated with blood. - Medication does not stop your pain. - Injury of the foot occurs. - Any symptoms of infection including redness, odor, or red streaks running from wound.   General Anesthesia, Adult, Care After This sheet gives you information about how to care for yourself after your procedure. Your health care provider may also give you more specific instructions. If you have problems or questions, contact your health care provider. What can I expect after the procedure? After the procedure, the following side effects are common:  Pain or discomfort at the IV site.  Nausea.  Vomiting.  Sore throat.  Trouble concentrating.  Feeling cold or chills.  Weak or tired.  Sleepiness and fatigue.  Soreness and body aches. These side effects can affect parts of the body that were not involved in surgery. Follow these instructions at home:  For at least 24 hours after the procedure:  Have a responsible adult stay with you. It is  important to have someone help care for you until you are awake and alert.  Rest as needed.  Do not: ? Participate in activities in which you could fall or become injured. ? Drive. ? Use heavy machinery. ? Drink alcohol. ? Take sleeping pills or medicines that cause drowsiness. ? Make important decisions or sign legal documents. ? Take care of children on your own. Eating and drinking  Follow any instructions from your health care provider about eating or drinking restrictions.  When you feel hungry, start by eating small amounts of foods that are soft and easy to digest (bland), such as toast. Gradually return to your regular diet.  Drink enough fluid to keep your urine pale yellow.  If you vomit, rehydrate by drinking water, juice, or clear broth. General instructions  If you have sleep apnea, surgery and certain medicines can increase your risk for breathing problems. Follow instructions from your health care provider about wearing your sleep device: ? Anytime you are sleeping, including during daytime naps. ? While taking prescription pain medicines, sleeping medicines, or medicines that make you drowsy.  Return to your normal activities as told by your health care provider. Ask your health care provider what activities are safe for you.  Take over-the-counter and prescription medicines only as told by your health care provider.  If you smoke, do not smoke without supervision.  Keep all follow-up visits as told by your health care provider. This is important. Contact a health care provider if:    You have nausea or vomiting that does not get better with medicine.  You cannot eat or drink without vomiting.  You have pain that does not get better with medicine.  You are unable to pass urine.  You develop a skin rash.  You have a fever.  You have redness around your IV site that gets worse. Get help right away if:  You have difficulty breathing.  You have chest  pain.  You have blood in your urine or stool, or you vomit blood. Summary  After the procedure, it is common to have a sore throat or nausea. It is also common to feel tired.  Have a responsible adult stay with you for the first 24 hours after general anesthesia. It is important to have someone help care for you until you are awake and alert.  When you feel hungry, start by eating small amounts of foods that are soft and easy to digest (bland), such as toast. Gradually return to your regular diet.  Drink enough fluid to keep your urine pale yellow.  Return to your normal activities as told by your health care provider. Ask your health care provider what activities are safe for you. This information is not intended to replace advice given to you by your health care provider. Make sure you discuss any questions you have with your health care provider. Document Revised: 11/08/2017 Document Reviewed: 06/21/2017 Elsevier Patient Education  2020 Elsevier Inc.  

## 2020-01-13 ENCOUNTER — Other Ambulatory Visit: Payer: Self-pay

## 2020-01-13 ENCOUNTER — Ambulatory Visit: Payer: PPO | Admitting: Anesthesiology

## 2020-01-13 ENCOUNTER — Encounter
Admission: RE | Disposition: A | Discharge status: Home / Self Care | Payer: Self-pay | Source: Home / Self Care | Attending: Podiatry

## 2020-01-13 ENCOUNTER — Encounter: Payer: Self-pay | Admitting: Podiatry

## 2020-01-13 ENCOUNTER — Ambulatory Visit
Admission: RE | Admit: 2020-01-13 | Discharge: 2020-01-13 | Disposition: A | Discharge status: Home / Self Care | Payer: PPO | Attending: Podiatry | Admitting: Podiatry

## 2020-01-13 DIAGNOSIS — Z791 Long term (current) use of non-steroidal anti-inflammatories (NSAID): Secondary | ICD-10-CM | POA: Insufficient documentation

## 2020-01-13 DIAGNOSIS — M2012 Hallux valgus (acquired), left foot: Secondary | ICD-10-CM | POA: Insufficient documentation

## 2020-01-13 DIAGNOSIS — Z87891 Personal history of nicotine dependence: Secondary | ICD-10-CM | POA: Insufficient documentation

## 2020-01-13 DIAGNOSIS — K219 Gastro-esophageal reflux disease without esophagitis: Secondary | ICD-10-CM | POA: Insufficient documentation

## 2020-01-13 DIAGNOSIS — Z8616 Personal history of COVID-19: Secondary | ICD-10-CM | POA: Insufficient documentation

## 2020-01-13 HISTORY — DX: COVID-19: U07.1

## 2020-01-13 HISTORY — PX: OSTECTOMY: SHX6439

## 2020-01-13 HISTORY — DX: Gastro-esophageal reflux disease without esophagitis: K21.9

## 2020-01-13 SURGERY — OSTECTOMY
Anesthesia: General | Site: Foot | Laterality: Left

## 2020-01-13 MED ORDER — GLYCOPYRROLATE 0.2 MG/ML IJ SOLN
INTRAMUSCULAR | Status: DC | PRN
  Administered 2020-01-13: .1 mg via INTRAVENOUS

## 2020-01-13 MED ORDER — LIDOCAINE HCL (CARDIAC) PF 100 MG/5ML IV SOSY
PREFILLED_SYRINGE | INTRAVENOUS | Status: DC | PRN
  Administered 2020-01-13: 40 mg via INTRATRACHEAL

## 2020-01-13 MED ORDER — FENTANYL CITRATE (PF) 100 MCG/2ML IJ SOLN
INTRAMUSCULAR | Status: DC | PRN
  Administered 2020-01-13: 50 ug via INTRAVENOUS

## 2020-01-13 MED ORDER — DEXAMETHASONE SODIUM PHOSPHATE 4 MG/ML IJ SOLN
INTRAMUSCULAR | Status: DC | PRN
  Administered 2020-01-13: 4 mg via INTRAVENOUS

## 2020-01-13 MED ORDER — EPHEDRINE SULFATE 50 MG/ML IJ SOLN
INTRAMUSCULAR | Status: DC | PRN
  Administered 2020-01-13 (×2): 5 mg via INTRAVENOUS
  Administered 2020-01-13: 10 mg via INTRAVENOUS
  Administered 2020-01-13: 5 mg via INTRAVENOUS

## 2020-01-13 MED ORDER — CEFAZOLIN SODIUM-DEXTROSE 2-4 GM/100ML-% IV SOLN
2.0000 g | INTRAVENOUS | Status: AC
  Administered 2020-01-13: 2 g via INTRAVENOUS

## 2020-01-13 MED ORDER — PROPOFOL 10 MG/ML IV BOLUS
INTRAVENOUS | Status: DC | PRN
  Administered 2020-01-13: 120 mg via INTRAVENOUS

## 2020-01-13 MED ORDER — BUPIVACAINE HCL (PF) 0.25 % IJ SOLN
INTRAMUSCULAR | Status: DC | PRN
  Administered 2020-01-13: 10 mL

## 2020-01-13 MED ORDER — MIDAZOLAM HCL 5 MG/5ML IJ SOLN
INTRAMUSCULAR | Status: DC | PRN
  Administered 2020-01-13: 1 mg via INTRAVENOUS

## 2020-01-13 MED ORDER — LACTATED RINGERS IV SOLN: INTRAVENOUS | Status: DC

## 2020-01-13 MED ORDER — POVIDONE-IODINE 7.5 % EX SOLN: Freq: Once | CUTANEOUS | Status: DC

## 2020-01-13 MED ORDER — BUPIVACAINE LIPOSOME 1.3 % IJ SUSP
INTRAMUSCULAR | Status: DC | PRN
  Administered 2020-01-13 (×2): 10 mL

## 2020-01-13 MED ORDER — OXYCODONE-ACETAMINOPHEN 5-325 MG PO TABS: 1.0000 | ORAL_TABLET | ORAL | 0 refills | Status: AC | PRN

## 2020-01-13 SURGICAL SUPPLY — 45 items
BENZOIN TINCTURE PRP APPL 2/3 (GAUZE/BANDAGES/DRESSINGS) ×2 IMPLANT
BIT DRILL CANNULATED 2.0 (Miscellaneous) ×1 IMPLANT
BLADE MED AGGRESSIVE (BLADE) ×1 IMPLANT
BLADE SURG 15 STRL LF DISP TIS (BLADE) IMPLANT
BLADE SURG 15 STRL SS (BLADE) ×1
BNDG COHESIVE 4X5 TAN STRL (GAUZE/BANDAGES/DRESSINGS) ×2 IMPLANT
BNDG ELASTIC 4X5.8 VLCR STR LF (GAUZE/BANDAGES/DRESSINGS) ×2 IMPLANT
BNDG ESMARK 4X12 TAN STRL LF (GAUZE/BANDAGES/DRESSINGS) ×2 IMPLANT
BNDG GAUZE 4.5X4.1 6PLY STRL (MISCELLANEOUS) ×2 IMPLANT
BNDG STRETCH 4X75 STRL LF (GAUZE/BANDAGES/DRESSINGS) ×2 IMPLANT
CANISTER SUCT 1200ML W/VALVE (MISCELLANEOUS) ×2 IMPLANT
CNTRSNK DRL 2.5X2.5X (MISCELLANEOUS) IMPLANT
COUNTERSINK 2.5 (MISCELLANEOUS) ×1
COVER LIGHT HANDLE UNIVERSAL (MISCELLANEOUS) ×4 IMPLANT
CUFF TOURN SGL QUICK 18X4 (TOURNIQUET CUFF) ×1 IMPLANT
DRAPE FLUOR MINI C-ARM 54X84 (DRAPES) ×2 IMPLANT
DURAPREP 26ML APPLICATOR (WOUND CARE) ×2 IMPLANT
ELECT REM PT RETURN 9FT ADLT (ELECTROSURGICAL) ×2
ELECTRODE REM PT RTRN 9FT ADLT (ELECTROSURGICAL) ×1 IMPLANT
GAUZE SPONGE 4X4 12PLY STRL (GAUZE/BANDAGES/DRESSINGS) ×2 IMPLANT
GAUZE XEROFORM 1X8 LF (GAUZE/BANDAGES/DRESSINGS) ×2 IMPLANT
GLOVE BIO SURGEON STRL SZ7.5 (GLOVE) ×3 IMPLANT
GLOVE INDICATOR 8.0 STRL GRN (GLOVE) ×3 IMPLANT
GOWN STRL REUS W/ TWL LRG LVL3 (GOWN DISPOSABLE) ×2 IMPLANT
GOWN STRL REUS W/TWL LRG LVL3 (GOWN DISPOSABLE) ×2
GUIDEWIRE .9 (WIRE) ×3 IMPLANT
K-WIRE DBL END TROCAR 6X.045 (WIRE) ×2
K-WIRE DBL END TROCAR 6X.062 (WIRE) ×2
KIT TURNOVER KIT A (KITS) ×2 IMPLANT
KWIRE DBL END TROCAR 6X.045 (WIRE) IMPLANT
KWIRE DBL END TROCAR 6X.062 (WIRE) IMPLANT
NS IRRIG 500ML POUR BTL (IV SOLUTION) ×2 IMPLANT
PACK EXTREMITY ARMC (MISCELLANEOUS) ×2 IMPLANT
PENCIL SMOKE EVACUATOR (MISCELLANEOUS) ×2 IMPLANT
RASP SM TEAR CROSS CUT (RASP) ×1 IMPLANT
SCREW 2.5X16 HEADLESS (Screw) ×1 IMPLANT
STOCKINETTE IMPERVIOUS LG (DRAPES) ×2 IMPLANT
STRIP CLOSURE SKIN 1/4X4 (GAUZE/BANDAGES/DRESSINGS) ×2 IMPLANT
SUT MNCRL+ 5-0 UNDYED PC-3 (SUTURE) IMPLANT
SUT MONOCRYL 5-0 (SUTURE) ×1
SUT VIC AB 3-0 SH 27 (SUTURE) ×2
SUT VIC AB 3-0 SH 27X BRD (SUTURE) IMPLANT
SUT VIC AB 4-0 RB1 27 (SUTURE) ×1
SUT VIC AB 4-0 RB1 27X BRD (SUTURE) IMPLANT
dual trocar pts ×1 IMPLANT

## 2020-01-13 NOTE — H&P (Signed)
HISTORY AND PHYSICAL INTERVAL NOTE:  01/13/2020  11:50 AM  Donna Powell  has presented today for surgery, with the diagnosis of M20.12 [name].  The various methods of treatment have been discussed with the patient.  No guarantees were given.  After consideration of risks, benefits and other options for treatment, the patient has consented to surgery.  I have reviewed the patients' chart and labs.     A history and physical examination was performed in my office.  The patient was reexamined.  There have been no changes to this history and physical examination.  Donna Powell A

## 2020-01-13 NOTE — Anesthesia Postprocedure Evaluation (Signed)
Anesthesia Post Note  Patient: Donna Powell  Procedure(s) Performed: DOUBLE OSTECTOMY LEFT (Left Foot)     Anesthesia Post Evaluation  Ardeth Sportsman

## 2020-01-13 NOTE — Anesthesia Procedure Notes (Signed)
Procedure Name: LMA Insertion Date/Time: 01/13/2020 12:05 PM Performed by: Mayme Genta, CRNA Pre-anesthesia Checklist: Patient identified, Emergency Drugs available, Suction available, Timeout performed and Patient being monitored Patient Re-evaluated:Patient Re-evaluated prior to induction Oxygen Delivery Method: Circle system utilized Preoxygenation: Pre-oxygenation with 100% oxygen Induction Type: IV induction LMA: LMA inserted LMA Size: 4.0 Number of attempts: 1 Placement Confirmation: positive ETCO2 and breath sounds checked- equal and bilateral Tube secured with: Tape

## 2020-01-13 NOTE — Anesthesia Preprocedure Evaluation (Addendum)
Anesthesia Evaluation  Patient identified by MRN, date of birth, ID band  Reviewed: Allergy & Precautions, NPO status , Patient's Chart, lab work & pertinent test results  History of Anesthesia Complications Negative for: history of anesthetic complications  Airway Mallampati: II  TM Distance: >3 FB Neck ROM: full    Dental no notable dental hx.    Pulmonary former smoker,  Covid positive 11/05/2020 - patient had runny nose and sore throat for approximately 1 week.   Pulmonary exam normal        Cardiovascular Exercise Tolerance: Good negative cardio ROS Normal cardiovascular exam     Neuro/Psych negative psych ROS   GI/Hepatic Neg liver ROS, GERD  Controlled,  Endo/Other  negative endocrine ROS  Renal/GU negative Renal ROS  negative genitourinary   Musculoskeletal  (+) Arthritis , HALLUX VALGUS LEFT FOOT   Abdominal Normal abdominal exam  (+)   Peds  Hematology negative hematology ROS (+)   Anesthesia Other Findings   Reproductive/Obstetrics                           Anesthesia Physical  Anesthesia Plan  ASA: II  Anesthesia Plan: General   Post-op Pain Management:    Induction:   PONV Risk Score and Plan: 2 and Ondansetron and Treatment may vary due to age or medical condition  Airway Management Planned: LMA  Additional Equipment:   Intra-op Plan:   Post-operative Plan: Extubation in OR  Informed Consent: I have reviewed the patients History and Physical, chart, labs and discussed the procedure including the risks, benefits and alternatives for the proposed anesthesia with the patient or authorized representative who has indicated his/her understanding and acceptance.       Plan Discussed with: CRNA  Anesthesia Plan Comments:         Anesthesia Quick Evaluation

## 2020-01-13 NOTE — Transfer of Care (Signed)
Immediate Anesthesia Transfer of Care Note  Patient: Donna Powell  Procedure(s) Performed: DOUBLE OSTECTOMY LEFT (Left Foot)  Patient Location: PACU  Anesthesia Type: General  Level of Consciousness: awake, alert  and patient cooperative  Airway and Oxygen Therapy: Patient Spontanous Breathing and Patient connected to supplemental oxygen  Post-op Assessment: Post-op Vital signs reviewed, Patient's Cardiovascular Status Stable, Respiratory Function Stable, Patent Airway and No signs of Nausea or vomiting  Post-op Vital Signs: Reviewed and stable  Complications: No apparent anesthesia complications

## 2020-01-13 NOTE — Op Note (Signed)
Operative note   Surgeon:Karthikeya Funke Lawyer: None    Preop diagnosis: Hallux valgus left foot    Postop diagnosis: Same    Procedure: [place] hallux valgus correction left foot    EBL: Minimal    Anesthesia:local and general.  Local consisted of a one-to-one mixture of 0.25% bupivacaine and Exparel long-acting anesthetic.    Hemostasis: Ankle tourniquet inflated to 200 mmHg for approximately 45 minutes    Specimen: None    Complications: None    Operative indications:Donna Powell is an 69 y.o. that presents today for surgical intervention.  The risks/benefits/alternatives/complications have been discussed and consent has been given.    Procedure:  Patient was brought into the OR and placed on the operating table in thesupine position. After anesthesia was obtained theleft lower extremity was prepped and draped in usual sterile fashion.  Attention was directed to the dorsomedial left first MTPJ where an incision was performed.  Sharp and blunt dissection carried down to the capsule.  The intermetatarsal space was entered.  The DTI L was transected.  The conjoined tendon of the abductor was noted and transected.  Next a T capsulotomy was performed.  The prominent dorsomedial eminence was transected and removed with a power saw.  A V osteotomy was created.  The capital fragment was translocated laterally.  This was stabilized with a 0.062 K wire initially.  Next a 2.0 mm headless screw was placed from dorsal distal to plantar proximal.  This was a 16 mm screw.  Good stability and realignment was noted.  The ensuing overhanging ledge was transected with a power saw and smoothed with a power rasp.  The wound was flushed with copious amounts of irrigation.  A small capsulorrhaphy was performed medially.  Closure was then performed with a 3-0 Vicryl the capsule, 4-0 Vicryl for subcutaneous tissue and a 5-0 Monocryl undyed for skin.    Patient tolerated the procedure and anesthesia  well.  Was transported from the OR to the PACU with all vital signs stable and vascular status intact. To be discharged per routine protocol.  Will follow up in approximately 1 week in the outpatient clinic.

## 2020-01-14 ENCOUNTER — Encounter: Payer: Self-pay | Admitting: *Deleted

## 2020-01-18 DIAGNOSIS — M2012 Hallux valgus (acquired), left foot: Secondary | ICD-10-CM | POA: Diagnosis not present

## 2020-01-25 DIAGNOSIS — M2012 Hallux valgus (acquired), left foot: Secondary | ICD-10-CM | POA: Diagnosis not present

## 2020-02-29 DIAGNOSIS — M2012 Hallux valgus (acquired), left foot: Secondary | ICD-10-CM | POA: Diagnosis not present

## 2020-05-02 ENCOUNTER — Other Ambulatory Visit: Payer: Self-pay | Admitting: Family Medicine

## 2020-05-02 DIAGNOSIS — M2012 Hallux valgus (acquired), left foot: Secondary | ICD-10-CM | POA: Diagnosis not present

## 2020-05-03 ENCOUNTER — Other Ambulatory Visit: Payer: Self-pay | Admitting: Family Medicine

## 2020-05-03 DIAGNOSIS — Z1231 Encounter for screening mammogram for malignant neoplasm of breast: Secondary | ICD-10-CM

## 2020-05-26 ENCOUNTER — Ambulatory Visit
Admission: RE | Admit: 2020-05-26 | Discharge: 2020-05-26 | Disposition: A | Discharge status: Home / Self Care | Payer: Medicare Other | Source: Ambulatory Visit | Attending: Family Medicine | Admitting: Family Medicine

## 2020-05-26 DIAGNOSIS — Z1231 Encounter for screening mammogram for malignant neoplasm of breast: Secondary | ICD-10-CM | POA: Insufficient documentation

## 2021-07-06 ENCOUNTER — Other Ambulatory Visit: Payer: Self-pay | Admitting: Family Medicine

## 2021-07-06 DIAGNOSIS — Z1231 Encounter for screening mammogram for malignant neoplasm of breast: Secondary | ICD-10-CM

## 2021-07-11 ENCOUNTER — Ambulatory Visit
Admission: RE | Admit: 2021-07-11 | Discharge: 2021-07-11 | Disposition: A | Discharge status: Home / Self Care | Payer: Medicare Other | Source: Ambulatory Visit | Attending: Family Medicine | Admitting: Family Medicine

## 2021-07-11 ENCOUNTER — Other Ambulatory Visit: Payer: Self-pay

## 2021-07-11 DIAGNOSIS — Z1231 Encounter for screening mammogram for malignant neoplasm of breast: Secondary | ICD-10-CM | POA: Insufficient documentation

## 2021-09-21 ENCOUNTER — Ambulatory Visit: Payer: Medicare Other

## 2021-10-04 ENCOUNTER — Other Ambulatory Visit: Payer: Self-pay

## 2021-10-04 ENCOUNTER — Ambulatory Visit (LOCAL_COMMUNITY_HEALTH_CENTER): Payer: Medicare Other

## 2021-10-04 DIAGNOSIS — Z719 Counseling, unspecified: Secondary | ICD-10-CM

## 2021-10-04 DIAGNOSIS — Z23 Encounter for immunization: Secondary | ICD-10-CM | POA: Diagnosis not present

## 2021-10-04 NOTE — Progress Notes (Signed)
Patient really not sure why she was here today.  She said her PCP sent her here for shots.  TC to PCP (Dr. Lovie Macadamia), spoke with [facility] and she spoke with PCP's nurse.  Patient needs Flu and Tdap vaccines and Shingles vaccine.  I explained to [facility] that we would gladly give the Flu and Tdap today, but patient would need to go to her Pharmacy for the Shingles vaccine.  She was agreeable with the plan.  Vaccines given today and tolerated well.  [facility] copy given to patient today.  Dahlia Bailiff, RN

## 2022-06-04 ENCOUNTER — Other Ambulatory Visit: Payer: Self-pay | Admitting: Family Medicine

## 2022-06-04 DIAGNOSIS — Z1231 Encounter for screening mammogram for malignant neoplasm of breast: Secondary | ICD-10-CM

## 2022-07-12 ENCOUNTER — Ambulatory Visit
Admission: RE | Admit: 2022-07-12 | Discharge: 2022-07-12 | Disposition: A | Discharge status: Home / Self Care | Payer: Medicare Other | Source: Ambulatory Visit | Attending: Family Medicine | Admitting: Family Medicine

## 2022-07-12 DIAGNOSIS — Z1231 Encounter for screening mammogram for malignant neoplasm of breast: Secondary | ICD-10-CM | POA: Diagnosis present

## 2023-01-02 IMAGING — MG MM DIGITAL SCREENING BILAT W/ TOMO AND CAD
6 of 12 series · 6 of 36 positions shown · non-contrast
Comparison: Previous exam(s).

CLINICAL DATA: Screening.

EXAM:
DIGITAL SCREENING BILATERAL MAMMOGRAM WITH TOMOSYNTHESIS AND CAD
TECHNIQUE: Bilateral screening digital craniocaudal and mediolateral oblique
mammograms were obtained. Bilateral screening digital breast
tomosynthesis was performed. The images were evaluated with
computer-aided detection.

[R MLO synth-2D (1 of 2)]
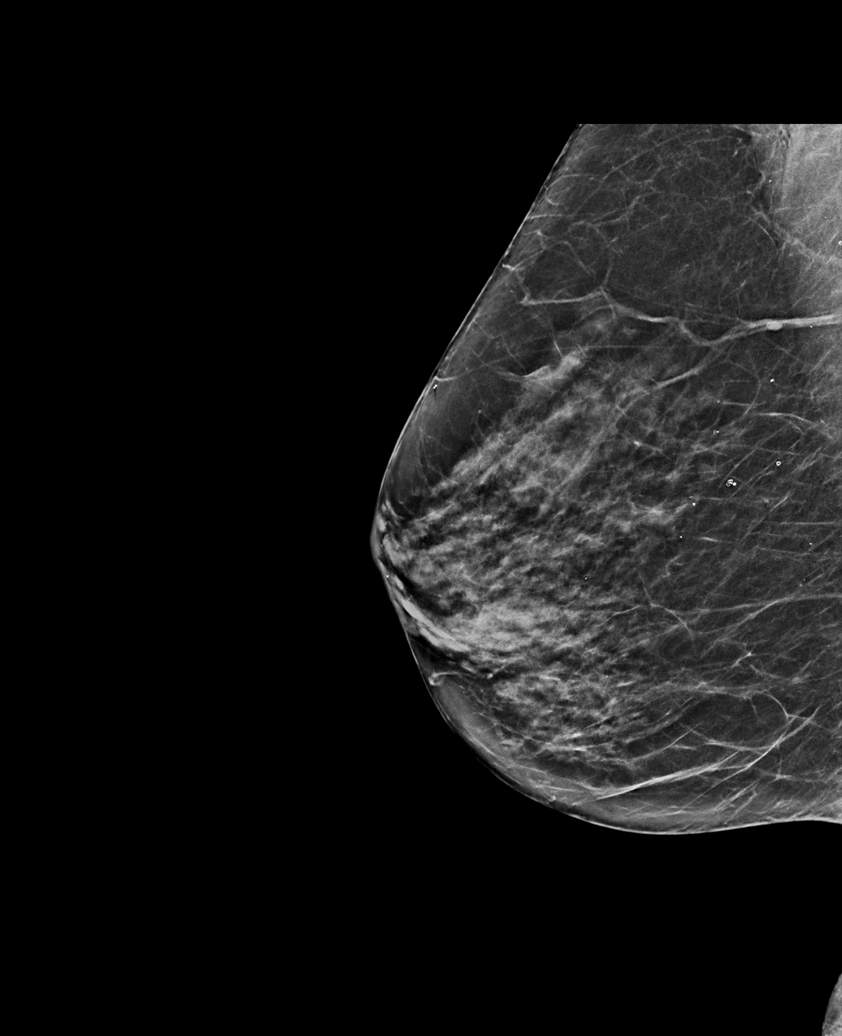

[R MLO synth-2D (2 of 2)]
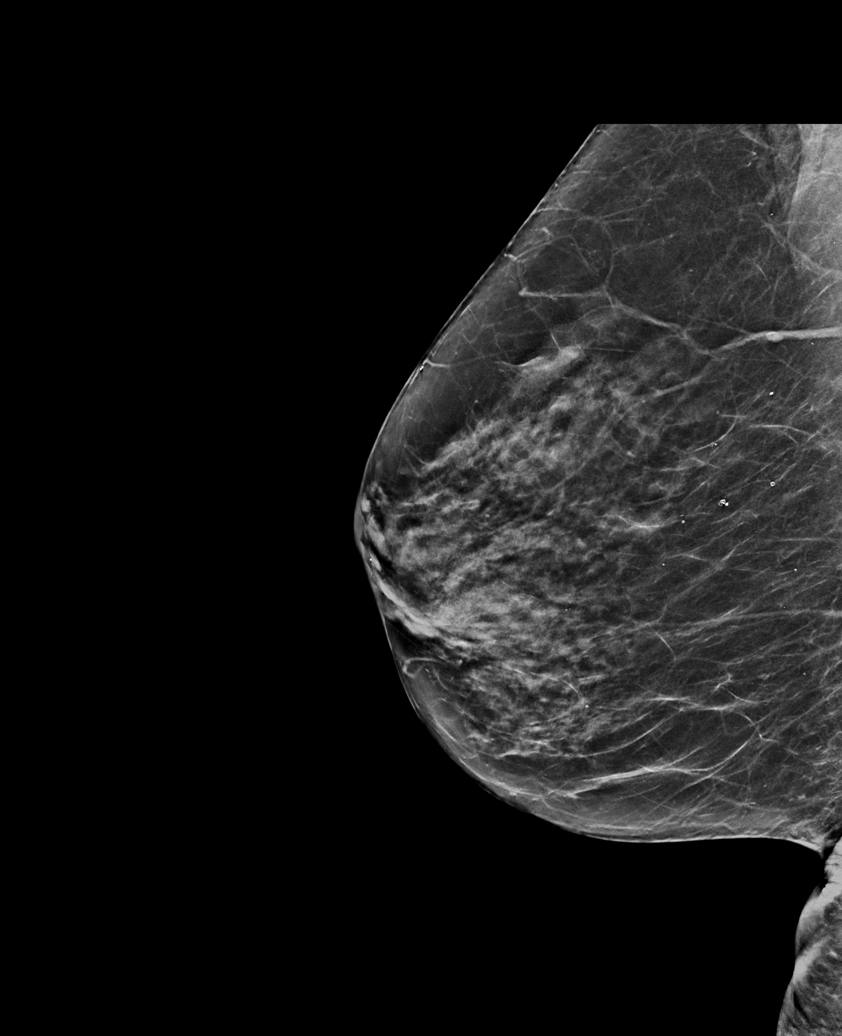

[L MLO synth-2D]
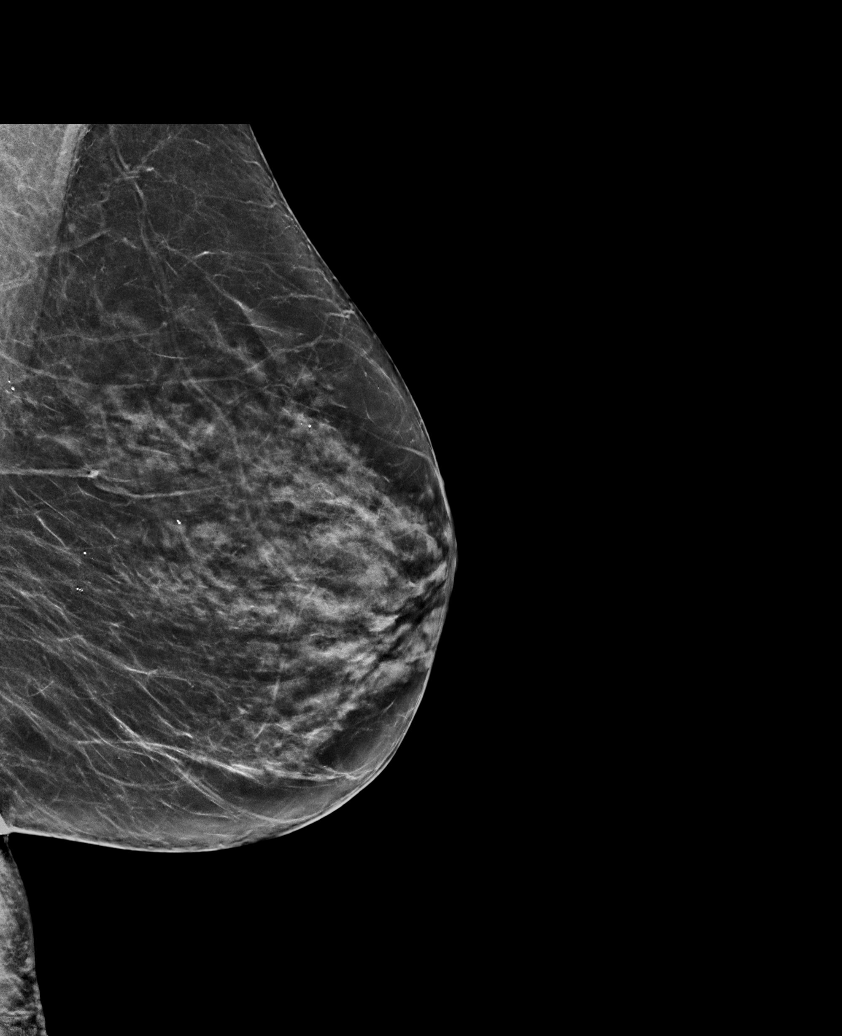

[L CC synth-2D]
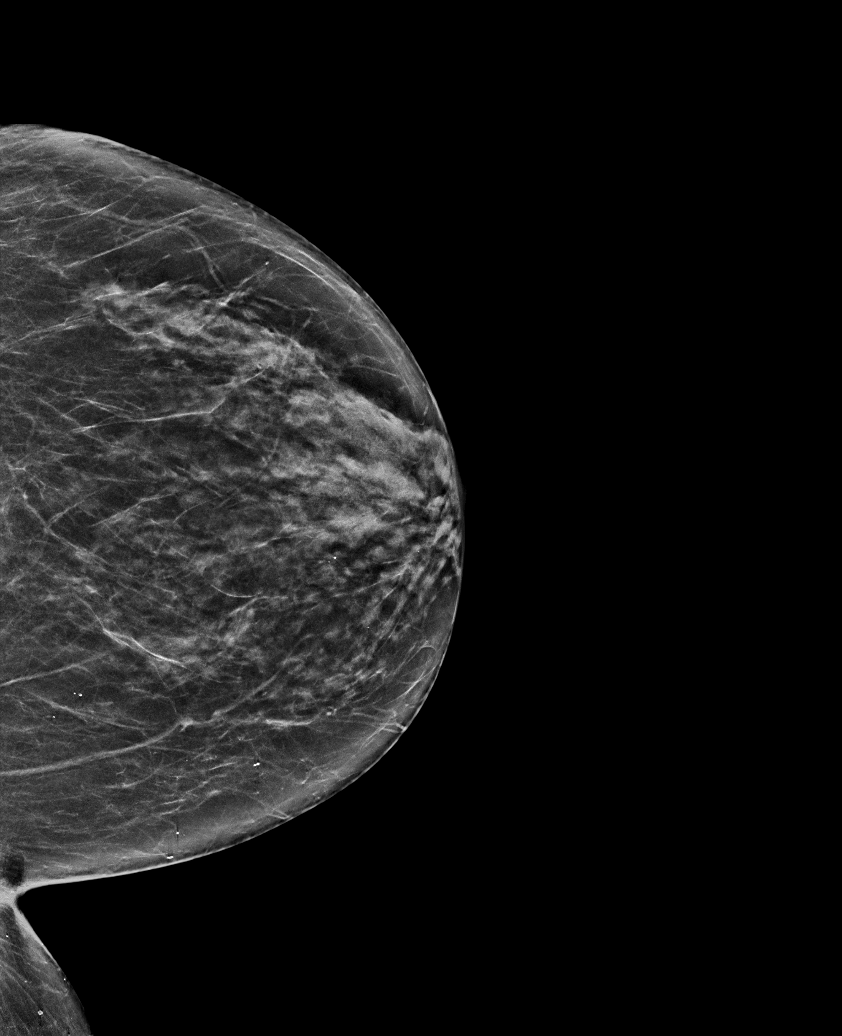

[R CC synth-2D (1 of 2)]
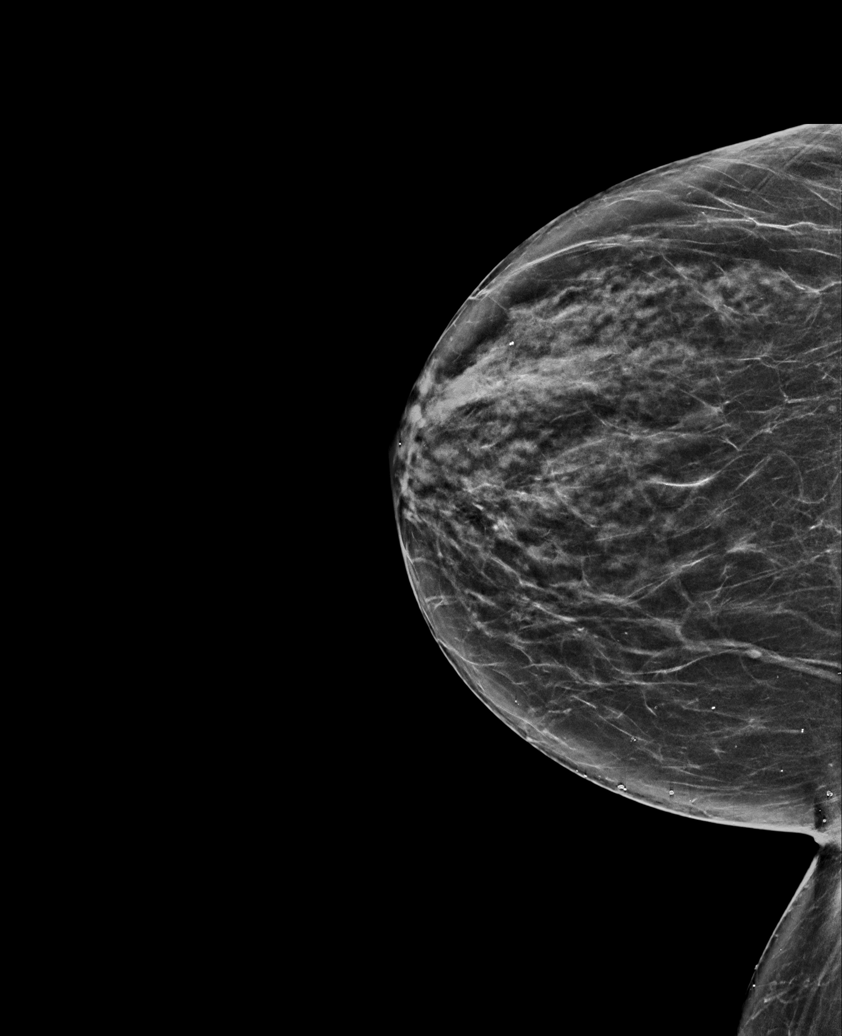

[R CC synth-2D (2 of 2)]
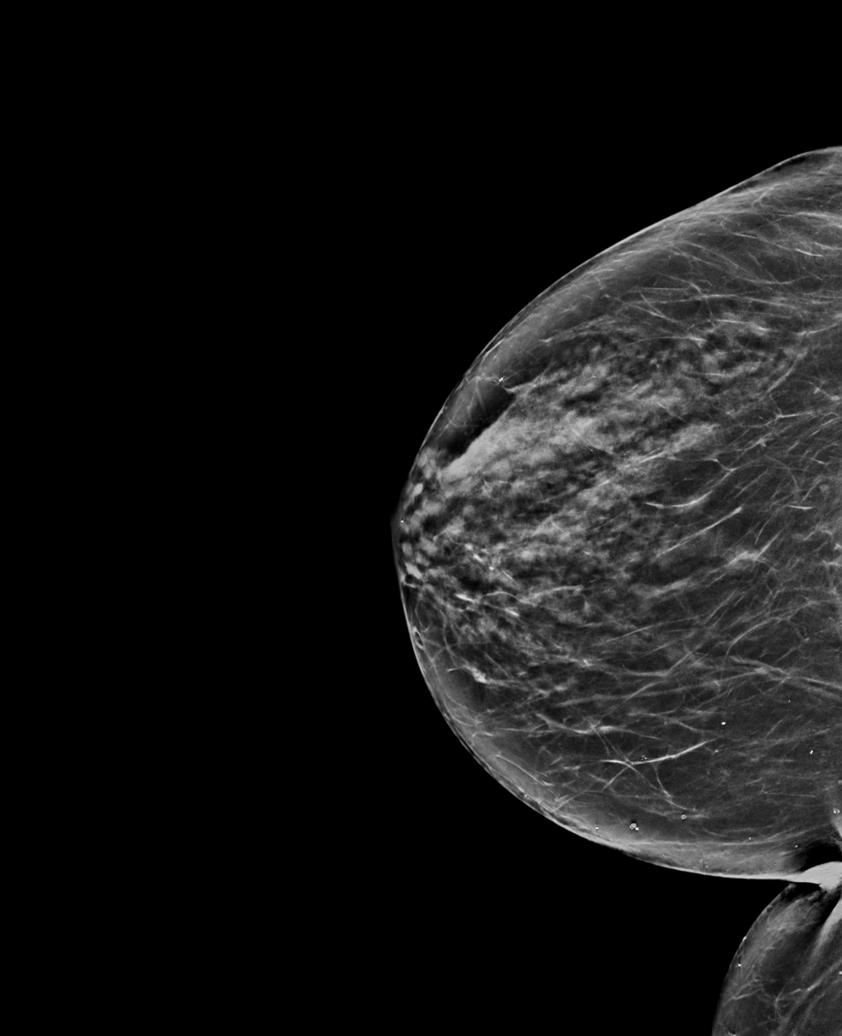

[6 of 36 positions shown; findings below may reference images not displayed]

ACR Breast Density Category c: The breast tissue is heterogeneously
dense, which may obscure small masses.
FINDINGS: There are no findings suspicious for malignancy.
IMPRESSION: No mammographic evidence of malignancy. A result letter of this
screening mammogram will be mailed directly to the patient.

RECOMMENDATION:
Screening mammogram in one year. (Code:[id number]-W-BC3)

BI-RADS CATEGORY  1: Negative.

## 2023-01-15 ENCOUNTER — Emergency Department
Admission: EM | Admit: 2023-01-15 | Discharge: 2023-01-15 | Disposition: A | Discharge status: Home / Self Care | Payer: Medicare Other | Attending: Emergency Medicine | Admitting: Emergency Medicine

## 2023-01-15 ENCOUNTER — Encounter: Payer: Self-pay | Admitting: Emergency Medicine

## 2023-01-15 ENCOUNTER — Other Ambulatory Visit: Payer: Self-pay

## 2023-01-15 DIAGNOSIS — R112 Nausea with vomiting, unspecified: Secondary | ICD-10-CM | POA: Insufficient documentation

## 2023-01-15 DIAGNOSIS — I10 Essential (primary) hypertension: Secondary | ICD-10-CM | POA: Insufficient documentation

## 2023-01-15 DIAGNOSIS — A084 Viral intestinal infection, unspecified: Secondary | ICD-10-CM

## 2023-01-15 LAB — URINALYSIS, ROUTINE W REFLEX MICROSCOPIC
Bacteria, UA: NONE SEEN
Bilirubin Urine: NEGATIVE
Glucose, UA: NEGATIVE mg/dL
Ketones, ur: 20 mg/dL — AB
Leukocytes,Ua: NEGATIVE
Nitrite: NEGATIVE
Protein, ur: 30 mg/dL — AB
Specific Gravity, Urine: 1.017 (ref 1.005–1.030)
pH: 7 (ref 5.0–8.0)

## 2023-01-15 LAB — CBC
HCT: 42.8 % (ref 36.0–46.0)
Hemoglobin: 13.9 g/dL (ref 12.0–15.0)
MCH: 28.3 pg (ref 26.0–34.0)
MCHC: 32.5 g/dL (ref 30.0–36.0)
MCV: 87 fL (ref 80.0–100.0)
Platelets: 368 10*3/uL (ref 150–400)
RBC: 4.92 MIL/uL (ref 3.87–5.11)
RDW: 14.3 % (ref 11.5–15.5)
WBC: 10 10*3/uL (ref 4.0–10.5)
nRBC: 0 % (ref 0.0–0.2)

## 2023-01-15 LAB — COMPREHENSIVE METABOLIC PANEL
ALT: 23 U/L (ref 0–44)
AST: 30 U/L (ref 15–41)
Albumin: 3.9 g/dL (ref 3.5–5.0)
Alkaline Phosphatase: 61 U/L (ref 38–126)
Anion gap: 11 (ref 5–15)
BUN: 14 mg/dL (ref 8–23)
CO2: 25 mmol/L (ref 22–32)
Calcium: 9.4 mg/dL (ref 8.9–10.3)
Chloride: 99 mmol/L (ref 98–111)
Creatinine, Ser: 0.71 mg/dL (ref 0.44–1.00)
GFR, Estimated: >60 mL/min (ref >60)
Glucose, Bld: 119 mg/dL — ABNORMAL HIGH (ref 70–99)
Potassium: 4 mmol/L (ref 3.5–5.1)
Sodium: 135 mmol/L (ref 135–145)
Total Bilirubin: 1 mg/dL (ref 0.3–1.2)
Total Protein: 8.3 g/dL — ABNORMAL HIGH (ref 6.5–8.1)

## 2023-01-15 LAB — LIPASE, BLOOD: Lipase: 37 U/L (ref 11–51)

## 2023-01-15 MED ORDER — SODIUM CHLORIDE 0.9 % IV BOLUS
1000.0000 mL | Freq: Once | INTRAVENOUS | Status: AC
  Administered 2023-01-15: 1000 mL via INTRAVENOUS

## 2023-01-15 MED ORDER — ONDANSETRON 4 MG PO TBDP: 4.0000 mg | ORAL_TABLET | Freq: Three times a day (TID) | ORAL | 0 refills | Status: AC | PRN

## 2023-01-15 MED ORDER — ONDANSETRON HCL 4 MG/2ML IJ SOLN
4.0000 mg | Freq: Once | INTRAMUSCULAR | Status: AC
  Administered 2023-01-15: 4 mg via INTRAVENOUS
  Filled 2023-01-15: qty 2

## 2023-01-15 MED ORDER — ONDANSETRON 4 MG PO TBDP
4.0000 mg | ORAL_TABLET | Freq: Once | ORAL | Status: AC | PRN
  Administered 2023-01-15: 4 mg via ORAL
  Filled 2023-01-15: qty 1

## 2023-01-15 NOTE — ED Notes (Signed)
First nurse note: Pt arrives via EMS from home with vomiting since 9 pm last night after eating stew. No emesis for EMS. A&Ox4 134/82 99% RA 77 HR

## 2023-01-15 NOTE — ED Notes (Signed)
See triage note  Presents with vomiting since last pm  No fever

## 2023-01-15 NOTE — ED Provider Notes (Signed)
[facility] Provider Note    Event Date/Time   First MD Initiated Contact with Patient 01/15/23 1444     (approximate)  History   Chief Complaint: Nausea  HPI  Donna Powell is a 72 y.o. female with a past medical history of gastric reflux, rheumatoid arthritis, presents to the emergency department for nausea and vomiting.  According to the patient for the past 2 days she has been nauseated with frequent episodes of vomiting.  Has not had any diarrhea as of yet.  Denies any abdominal pain.  Denies any known sick contacts.  Patient denies any fever cough or congestion.  Physical Exam   Triage Vital Signs: ED Triage Vitals  Enc Vitals Group     BP 01/15/23 1407 (!) 179/99     Pulse Rate 01/15/23 1407 68     Resp 01/15/23 1407 16     Temp 01/15/23 1407 98.4 F (36.9 C)     Temp Source 01/15/23 1407 Oral     SpO2 01/15/23 1407 100 %     Weight 01/15/23 1409 131 lb (59.4 kg)     Height 01/15/23 1409 '5\' 5"'$  (1.651 m)     Head Circumference --      Peak Flow --      Pain Score 01/15/23 1408 9     Pain Loc --      Pain Edu? --      Excl. in [place]? --     Most recent vital signs: Vitals:   01/15/23 1407  BP: (!) 179/99  Pulse: 68  Resp: 16  Temp: 98.4 F (36.9 C)  SpO2: 100%    General: Awake, no distress.  CV:  Good peripheral perfusion.  Regular rate and rhythm  Resp:  Normal effort.  Equal breath sounds bilaterally.  Abd:  No distention.  Soft, nontender.  No rebound or guarding.  ED Results / Procedures / Treatments   MEDICATIONS ORDERED IN ED: Medications  sodium chloride 0.9 % bolus 1,000 mL (has no administration in time range)  ondansetron (ZOFRAN) injection 4 mg (has no administration in time range)  ondansetron (ZOFRAN-ODT) disintegrating tablet 4 mg (4 mg Oral Given 01/15/23 1418)     IMPRESSION / MDM / ASSESSMENT AND PLAN / ED COURSE  I reviewed the triage vital signs and the nursing notes.  Patient's presentation is most  consistent with acute presentation with potential threat to life or bodily function.  Patient presents emergency department for nausea and vomiting over the past 2 days.  Overall the patient appears well, no distress, reassuring vitals besides hypertension.  We will check labs including a CBC chemistry lipase and urinalysis.  We will IV hydrate and treat with IV Zofran.  Patient received oral Zofran earlier states she is feeling a little better but still nauseated.  Differential would include gastroenteritis viral syndrome such as norovirus or rotavirus, intra-abdominal pathology such as pancreatitis colitis diverticulitis or gastritis.    CBC is normal, chemistry is normal, lipase is normal, urinalysis shows a small amount of ketones but no other concerning findings.  Patient has received a liter of fluids as well as IV Zofran.  Patient is feeling better.  Will discharge with Zofran supportive care and PCP follow-up.  Provided my typical return precautions.  Patient agreeable to plan.  FINAL CLINICAL IMPRESSION(S) / ED DIAGNOSES   Nausea and vomiting  Rx / DC Orders   Zofran  Note:  This document was prepared using Dragon voice recognition  software and may include unintentional dictation errors.   Harvest Dark, MD 01/15/23 1622

## 2023-01-15 NOTE — ED Triage Notes (Signed)
Pt with N/V after eating stew about 2130 last night. Pt was awakened early this am and has vomited at least 10 times. Pt denies being exposed to anyone sick. Denies diarrhea.

## 2023-06-19 ENCOUNTER — Other Ambulatory Visit: Payer: Self-pay | Admitting: Family Medicine

## 2023-06-19 DIAGNOSIS — Z1231 Encounter for screening mammogram for malignant neoplasm of breast: Secondary | ICD-10-CM

## 2023-07-15 ENCOUNTER — Ambulatory Visit
Admission: RE | Admit: 2023-07-15 | Discharge: 2023-07-15 | Disposition: A | Discharge status: Home / Self Care | Payer: Medicare Other | Source: Ambulatory Visit | Attending: Family Medicine | Admitting: Family Medicine

## 2023-07-15 DIAGNOSIS — Z1231 Encounter for screening mammogram for malignant neoplasm of breast: Secondary | ICD-10-CM | POA: Insufficient documentation

## 2024-02-18 ENCOUNTER — Encounter: Payer: Self-pay | Admitting: Internal Medicine

## 2024-02-26 ENCOUNTER — Ambulatory Visit
Admission: RE | Admit: 2024-02-26 | Discharge: 2024-02-26 | Disposition: A | Discharge status: Home / Self Care | Payer: Medicare Other | Attending: Internal Medicine | Admitting: Internal Medicine

## 2024-02-26 ENCOUNTER — Ambulatory Visit: Admitting: Anesthesiology

## 2024-02-26 ENCOUNTER — Encounter: Payer: Self-pay | Admitting: Internal Medicine

## 2024-02-26 ENCOUNTER — Encounter
Admission: RE | Disposition: A | Discharge status: Home / Self Care | Payer: Self-pay | Source: Home / Self Care | Attending: Internal Medicine

## 2024-02-26 DIAGNOSIS — K641 Second degree hemorrhoids: Secondary | ICD-10-CM | POA: Insufficient documentation

## 2024-02-26 DIAGNOSIS — Z79899 Other long term (current) drug therapy: Secondary | ICD-10-CM | POA: Diagnosis not present

## 2024-02-26 DIAGNOSIS — Z1211 Encounter for screening for malignant neoplasm of colon: Secondary | ICD-10-CM | POA: Diagnosis present

## 2024-02-26 DIAGNOSIS — K219 Gastro-esophageal reflux disease without esophagitis: Secondary | ICD-10-CM | POA: Insufficient documentation

## 2024-02-26 DIAGNOSIS — K5730 Diverticulosis of large intestine without perforation or abscess without bleeding: Secondary | ICD-10-CM | POA: Diagnosis not present

## 2024-02-26 DIAGNOSIS — Z87891 Personal history of nicotine dependence: Secondary | ICD-10-CM | POA: Diagnosis not present

## 2024-02-26 HISTORY — PX: COLONOSCOPY WITH PROPOFOL: SHX5780

## 2024-02-26 SURGERY — COLONOSCOPY WITH PROPOFOL
Anesthesia: General

## 2024-02-26 MED ORDER — SODIUM CHLORIDE 0.9 % IV SOLN: INTRAVENOUS | Status: DC

## 2024-02-26 MED ORDER — PROPOFOL 500 MG/50ML IV EMUL
INTRAVENOUS | Status: DC | PRN
  Administered 2024-02-26: 100 ug/kg/min via INTRAVENOUS

## 2024-02-26 MED ORDER — GLYCOPYRROLATE 0.2 MG/ML IJ SOLN
INTRAMUSCULAR | Status: DC | PRN
  Administered 2024-02-26: .1 mg via INTRAVENOUS

## 2024-02-26 MED ORDER — PROPOFOL 10 MG/ML IV BOLUS
INTRAVENOUS | Status: DC | PRN
  Administered 2024-02-26: 50 mg via INTRAVENOUS

## 2024-02-26 MED ORDER — LIDOCAINE HCL (CARDIAC) PF 100 MG/5ML IV SOSY
PREFILLED_SYRINGE | INTRAVENOUS | Status: DC | PRN
  Administered 2024-02-26: 40 mg via INTRAVENOUS

## 2024-02-26 NOTE — Op Note (Signed)
 [facility] Gastroenterology Patient Name: Donna Powell Procedure Date: 02/26/2024 9:34 AM MRN: [id number] Account #: [id number] Date of Birth: 01-Oct-1951 Admit Type: Outpatient Age: 73 Room: [facility] ENDO ROOM 2 Gender: Female Note Status: Finalized Instrument Name: Nelda Marseille [id number] Procedure:             Colonoscopy Indications:           Screening for colorectal malignant neoplasm Providers:             Royce Macadamia K. Norma Fredrickson MD, MD Referring MD:          Dorothey Baseman Medicines:             Propofol per Anesthesia Complications:         No immediate complications. Estimated blood loss: None. Procedure:             Pre-Anesthesia Assessment:                        - The risks and benefits of the procedure and the                         sedation options and risks were discussed with the                         patient. All questions were answered and informed                         consent was obtained.                        - Patient identification and proposed procedure were                         verified prior to the procedure by the nurse. The                         procedure was verified in the procedure room.                        - ASA Grade Assessment: II - A patient with mild                         systemic disease.                        - After reviewing the risks and benefits, the patient                         was deemed in satisfactory condition to undergo the                         procedure.                        After obtaining informed consent, the colonoscope was                         passed under direct vision. Throughout the procedure,  the patient's blood pressure, pulse, and oxygen                         saturations were monitored continuously. The                         Colonoscope was introduced through the anus and                         advanced to the the cecum, identified by appendiceal                          orifice and ileocecal valve. The colonoscopy was                         performed without difficulty. The patient tolerated                         the procedure well. The quality of the bowel                         preparation was good. The ileocecal valve, appendiceal                         orifice, and rectum were photographed. Findings:      The perianal and digital rectal examinations were normal. Pertinent       negatives include normal sphincter tone and no palpable rectal lesions.      Non-bleeding internal hemorrhoids were found during retroflexion. The       hemorrhoids were Grade II (internal hemorrhoids that prolapse but reduce       spontaneously).      Many small-mouthed diverticula were found in the sigmoid colon.      The exam was otherwise without abnormality. Impression:            - Non-bleeding internal hemorrhoids.                        - Diverticulosis in the sigmoid colon.                        - The examination was otherwise normal.                        - No specimens collected. Recommendation:        - Patient has a contact number available for                         emergencies. The signs and symptoms of potential                         delayed complications were discussed with the patient.                         Return to normal activities tomorrow. Written                         discharge instructions were provided to the patient.                        -  Resume previous diet.                        - Continue present medications.                        - You do NOT require further colon cancer screening                         measures (Annual stool testing (i.e. hemoccult, FIT,                         cologuard), sigmoidoscopy, colonoscopy or CT                         colonography). You should share this recommendation                         with your Primary Care provider.                        - Return to GI office PRN.                         - The findings and recommendations were discussed with                         the patient. Procedure Code(s):     --- Professional ---                        Z6109, Colorectal cancer screening; colonoscopy on                         individual not meeting criteria for high risk Diagnosis Code(s):     --- Professional ---                        K57.30, Diverticulosis of large intestine without                         perforation or abscess without bleeding                        K64.1, Second degree hemorrhoids                        Z12.11, Encounter for screening for malignant neoplasm                         of colon CPT copyright 2022 [employer]. All rights reserved. The codes documented in this report are preliminary and upon coder review may  be revised to meet current compliance requirements. Stanton Kidney MD, MD 02/26/2024 10:03:24 AM This report has been signed electronically. Number of Addenda: 0 Note Initiated On: 02/26/2024 9:34 AM Scope Withdrawal Time: 0 hours 6 minutes 0 seconds  Total Procedure Duration: 0 hours 10 minutes 6 seconds  Estimated Blood Loss:  Estimated blood loss: none.      [facility]

## 2024-02-26 NOTE — Transfer of Care (Signed)
 Immediate Anesthesia Transfer of Care Note  Patient: Donna Powell  Procedure(s) Performed: COLONOSCOPY WITH PROPOFOL  Patient Location: PACU  Anesthesia Type:General  Level of Consciousness: awake, alert , and oriented  Airway & Oxygen Therapy: Patient Spontanous Breathing  Post-op Assessment: Report given to RN and Post -op Vital signs reviewed and stable  Post vital signs: Reviewed and stable  Last Vitals:  Vitals Value Taken Time  BP 120/84 02/26/24 1005  Temp    Pulse 88 02/26/24 1007  Resp 19 02/26/24 1007  SpO2 100 % 02/26/24 1007  Vitals shown include unfiled device data.  Last Pain:  Vitals:   02/26/24 1005  TempSrc: Temporal  PainSc: 0-No pain       Patent airway to PACU. Pt awake, responsive and comfortable. VSS.  Complications: No notable events documented.

## 2024-02-26 NOTE — Interval H&P Note (Signed)
 History and Physical Interval Note:  02/26/2024 9:34 AM  Donna Powell  has presented today for surgery, with the diagnosis of 12.11 (ICD-10-CM) - Colon cancer screening.  The various methods of treatment have been discussed with the patient and family. After consideration of risks, benefits and other options for treatment, the patient has consented to  Procedure(s): COLONOSCOPY WITH PROPOFOL (N/A) as a surgical intervention.  The patient's history has been reviewed, patient examined, no change in status, stable for surgery.  I have reviewed the patient's chart and labs.  Questions were answered to the patient's satisfaction.     [place], [place]

## 2024-02-26 NOTE — H&P (Signed)
 Outpatient short stay form Pre-procedure 02/26/2024 9:34 AM Katriel Cutsforth K. Norma Fredrickson, M.D.  Primary Physician: Dorothey Baseman, M.D.  Reason for visit:  Colon cancer screening  History of present illness:  Patient presents for colonoscopy for colon cancer screening. The patient denies complaints of abdominal pain, significant change in bowel habits, or rectal bleeding.      Current Facility-Administered Medications:    0.9 %  sodium chloride infusion, , Intravenous, Continuous, [place], Boykin Nearing, MD, Last Rate: 20 mL/hr at 02/26/24 6578, New Bag at 02/26/24 4696  Medications Prior to Admission  Medication Sig Dispense Refill Last Dose/Taking   baclofen (LIORESAL) 10 MG tablet Take 10 mg by mouth 2 (two) times daily.      Calcium Carb-Cholecalciferol (CALCIUM + VITAMIN D3 PO) Take by mouth daily.      celecoxib (CELEBREX) 100 MG capsule Take 100 mg by mouth 2 (two) times daily. (Patient not taking: Reported on 10/04/2021)      fluticasone (FLONASE) 50 MCG/ACT nasal spray Place 2 sprays into both nostrils as needed for allergies or rhinitis.      Multiple Vitamin (MULTIVITAMIN) tablet Take 1 tablet by mouth daily.      omeprazole (PRILOSEC) 20 MG capsule Take 20 mg by mouth as needed.      ondansetron (ZOFRAN-ODT) 4 MG disintegrating tablet Take 1 tablet (4 mg total) by mouth every 8 (eight) hours as needed for nausea or vomiting. 20 tablet 0      Allergies  Allergen Reactions   Aspirin Nausea And Vomiting   Cola (Syrup) Nausea And Vomiting     Past Medical History:  Diagnosis Date   COVID-19 11/05/20   GERD (gastroesophageal reflux disease)    Medical history non-contributory    Rheumatoid arthritis (HCC)     Review of systems:  Otherwise negative.    Physical Exam  Gen: Alert, oriented. Appears stated age.  HEENT: [place]/AT. PERRLA. Lungs: CTA, no wheezes. CV: RR nl S1, S2. Abd: soft, benign, no masses. BS+ Ext: No edema. Pulses 2+    Planned procedures: Proceed with  colonoscopy. The patient understands the nature of the planned procedure, indications, risks, alternatives and potential complications including but not limited to bleeding, infection, perforation, damage to internal organs and possible oversedation/side effects from anesthesia. The patient agrees and gives consent to proceed.  Please refer to procedure notes for findings, recommendations and patient disposition/instructions.     Keylor Rands K. Norma Fredrickson, M.D. Gastroenterology 02/26/2024  9:34 AM

## 2024-02-26 NOTE — Anesthesia Preprocedure Evaluation (Signed)
 Anesthesia Evaluation  Patient identified by MRN, date of birth, ID band Patient awake    Reviewed: Allergy & Precautions, H&P , NPO status , Patient's Chart, lab work & pertinent test results, reviewed documented beta blocker date and time   History of Anesthesia Complications Negative for: history of anesthetic complications  Airway Mallampati: II  TM Distance: >3 FB Neck ROM: full    Dental  (+) Dental Advidsory Given   Pulmonary neg pulmonary ROS, former smoker   Pulmonary exam normal breath sounds clear to auscultation       Cardiovascular Exercise Tolerance: Good negative cardio ROS Normal cardiovascular exam Rhythm:regular Rate:Normal     Neuro/Psych negative neurological ROS  negative psych ROS   GI/Hepatic Neg liver ROS,GERD  Medicated and Controlled,,  Endo/Other  negative endocrine ROS    Renal/GU negative Renal ROS  negative genitourinary   Musculoskeletal   Abdominal   Peds  Hematology negative hematology ROS (+)   Anesthesia Other Findings Past Medical History: 11/05/20: COVID-19 No date: GERD (gastroesophageal reflux disease) No date: Medical history non-contributory No date: Rheumatoid arthritis (HCC)   Reproductive/Obstetrics negative OB ROS                             Anesthesia Physical Anesthesia Plan  ASA: 2  Anesthesia Plan: General   Post-op Pain Management:    Induction: Intravenous  PONV Risk Score and Plan: 3 and Propofol infusion, TIVA and Treatment may vary due to age or medical condition  Airway Management Planned: Natural Airway and Nasal Cannula  Additional Equipment:   Intra-op Plan:   Post-operative Plan:   Informed Consent: I have reviewed the patients History and Physical, chart, labs and discussed the procedure including the risks, benefits and alternatives for the proposed anesthesia with the patient or authorized representative who  has indicated his/her understanding and acceptance.     Dental Advisory Given  Plan Discussed with: Anesthesiologist, CRNA and Surgeon  Anesthesia Plan Comments:        Anesthesia Quick Evaluation

## 2024-02-27 ENCOUNTER — Encounter: Payer: Self-pay | Admitting: Internal Medicine

## 2024-02-28 NOTE — Anesthesia Postprocedure Evaluation (Signed)
 Anesthesia Post Note  Patient: Donna Powell  Procedure(s) Performed: COLONOSCOPY WITH PROPOFOL  Patient location during evaluation: Endoscopy Anesthesia Type: General Level of consciousness: awake and alert Pain management: pain level controlled Vital Signs Assessment: post-procedure vital signs reviewed and stable Respiratory status: spontaneous breathing, nonlabored ventilation, respiratory function stable and patient connected to nasal cannula oxygen Cardiovascular status: blood pressure returned to baseline and stable Postop Assessment: no apparent nausea or vomiting Anesthetic complications: no   No notable events documented.   Last Vitals:  Vitals:   02/26/24 0913 02/26/24 1025  BP: (!) 125/91 (!) 152/94  Pulse: 78   Resp: 18   Temp: (!) 35.8 C   SpO2: 100%     Last Pain:  Vitals:   02/26/24 1025  TempSrc:   PainSc: 0-No pain                 Lenard Simmer

## 2024-07-16 ENCOUNTER — Other Ambulatory Visit: Payer: Self-pay | Admitting: Family Medicine

## 2024-07-16 DIAGNOSIS — Z1231 Encounter for screening mammogram for malignant neoplasm of breast: Secondary | ICD-10-CM

## 2024-08-10 ENCOUNTER — Encounter

## 2024-08-18 ENCOUNTER — Other Ambulatory Visit: Payer: Self-pay | Admitting: Neurology

## 2024-08-18 DIAGNOSIS — G3184 Mild cognitive impairment, so stated: Secondary | ICD-10-CM

## 2024-08-23 ENCOUNTER — Ambulatory Visit
Admission: RE | Admit: 2024-08-23 | Discharge: 2024-08-23 | Disposition: A | Source: Ambulatory Visit | Attending: Neurology | Admitting: Neurology

## 2024-08-23 DIAGNOSIS — G3184 Mild cognitive impairment, so stated: Secondary | ICD-10-CM | POA: Diagnosis present

## 2024-09-01 ENCOUNTER — Ambulatory Visit
Admission: RE | Admit: 2024-09-01 | Discharge: 2024-09-01 | Disposition: A | Discharge status: Home / Self Care | Source: Ambulatory Visit | Attending: Family Medicine | Admitting: Family Medicine

## 2024-09-01 DIAGNOSIS — Z1231 Encounter for screening mammogram for malignant neoplasm of breast: Secondary | ICD-10-CM | POA: Insufficient documentation

## 2024-12-03 ENCOUNTER — Other Ambulatory Visit: Payer: Self-pay | Admitting: Surgery

## 2024-12-03 DIAGNOSIS — M7582 Other shoulder lesions, left shoulder: Secondary | ICD-10-CM

## 2024-12-03 DIAGNOSIS — M12812 Other specific arthropathies, not elsewhere classified, left shoulder: Secondary | ICD-10-CM

## 2024-12-03 DIAGNOSIS — M75122 Complete rotator cuff tear or rupture of left shoulder, not specified as traumatic: Secondary | ICD-10-CM

## 2024-12-08 ENCOUNTER — Ambulatory Visit
Admission: RE | Admit: 2024-12-08 | Discharge: 2024-12-08 | Disposition: A | Discharge status: Home / Self Care | Source: Ambulatory Visit | Attending: Surgery | Admitting: Surgery

## 2024-12-08 ENCOUNTER — Encounter: Payer: Self-pay | Admitting: Radiology

## 2024-12-08 DIAGNOSIS — M7582 Other shoulder lesions, left shoulder: Secondary | ICD-10-CM | POA: Insufficient documentation

## 2024-12-08 DIAGNOSIS — M75122 Complete rotator cuff tear or rupture of left shoulder, not specified as traumatic: Secondary | ICD-10-CM | POA: Insufficient documentation

## 2024-12-08 DIAGNOSIS — M12812 Other specific arthropathies, not elsewhere classified, left shoulder: Secondary | ICD-10-CM | POA: Insufficient documentation
